# Patient Record
Sex: Male | Born: 1955 | Race: White | Hispanic: No | Marital: Married | Smoking: Never smoker
Health system: Southern US, Community
[De-identification: ages and names within clinical notes are randomized; demographics above are authoritative.]

## PROBLEM LIST (undated history)

## (undated) DIAGNOSIS — I10 Essential (primary) hypertension: Secondary | ICD-10-CM

## (undated) DIAGNOSIS — E782 Mixed hyperlipidemia: Secondary | ICD-10-CM

## (undated) DIAGNOSIS — E039 Hypothyroidism, unspecified: Secondary | ICD-10-CM

## (undated) DIAGNOSIS — G4730 Sleep apnea, unspecified: Secondary | ICD-10-CM

## (undated) DIAGNOSIS — I509 Heart failure, unspecified: Secondary | ICD-10-CM

## (undated) DIAGNOSIS — I2510 Atherosclerotic heart disease of native coronary artery without angina pectoris: Secondary | ICD-10-CM

## (undated) DIAGNOSIS — Z889 Allergy status to unspecified drugs, medicaments and biological substances status: Secondary | ICD-10-CM

## (undated) DIAGNOSIS — E119 Type 2 diabetes mellitus without complications: Secondary | ICD-10-CM

## (undated) DIAGNOSIS — L0390 Cellulitis, unspecified: Secondary | ICD-10-CM

## (undated) DIAGNOSIS — E7800 Pure hypercholesterolemia, unspecified: Secondary | ICD-10-CM

## (undated) DIAGNOSIS — J45909 Unspecified asthma, uncomplicated: Secondary | ICD-10-CM

## (undated) HISTORY — DX: Allergy status to unspecified drugs, medicaments and biological substances: Z88.9

## (undated) HISTORY — PX: OTHER SURGICAL HISTORY: SHX169

## (undated) HISTORY — DX: Atherosclerotic heart disease of native coronary artery without angina pectoris: I25.10

## (undated) HISTORY — DX: Essential (primary) hypertension: I10

## (undated) HISTORY — PX: CARPAL TUNNEL RELEASE: SHX101

## (undated) HISTORY — DX: Pure hypercholesterolemia, unspecified: E78.00

## (undated) HISTORY — DX: Cellulitis, unspecified: L03.90

## (undated) HISTORY — DX: Mixed hyperlipidemia: E78.2

## (undated) HISTORY — PX: COLONOSCOPY: SHX174

## (undated) HISTORY — DX: Type 2 diabetes mellitus without complications: E11.9

---

## 1998-09-23 ENCOUNTER — Encounter: Payer: Self-pay | Admitting: Emergency Medicine

## 1998-09-23 ENCOUNTER — Emergency Department (HOSPITAL_COMMUNITY): Admission: EM | Admit: 1998-09-23 | Discharge: 1998-09-23 | Payer: Self-pay | Admitting: Emergency Medicine

## 2004-10-31 ENCOUNTER — Ambulatory Visit (HOSPITAL_COMMUNITY): Admission: RE | Admit: 2004-10-31 | Discharge: 2004-10-31 | Payer: Self-pay | Admitting: Gastroenterology

## 2005-04-29 ENCOUNTER — Emergency Department (HOSPITAL_COMMUNITY): Admission: EM | Admit: 2005-04-29 | Discharge: 2005-04-29 | Payer: Self-pay | Admitting: Emergency Medicine

## 2010-10-15 ENCOUNTER — Other Ambulatory Visit: Payer: Self-pay | Admitting: Internal Medicine

## 2010-10-15 DIAGNOSIS — M79642 Pain in left hand: Secondary | ICD-10-CM

## 2010-10-15 DIAGNOSIS — M79641 Pain in right hand: Secondary | ICD-10-CM

## 2011-03-02 ENCOUNTER — Other Ambulatory Visit: Payer: Self-pay | Admitting: Internal Medicine

## 2011-03-02 DIAGNOSIS — M79641 Pain in right hand: Secondary | ICD-10-CM

## 2014-10-19 ENCOUNTER — Ambulatory Visit: Payer: Self-pay | Admitting: Surgery

## 2014-10-25 ENCOUNTER — Ambulatory Visit (INDEPENDENT_AMBULATORY_CARE_PROVIDER_SITE_OTHER): Payer: BLUE CROSS/BLUE SHIELD | Admitting: Neurology

## 2014-10-25 ENCOUNTER — Ambulatory Visit (INDEPENDENT_AMBULATORY_CARE_PROVIDER_SITE_OTHER): Payer: Self-pay | Admitting: Neurology

## 2014-10-25 DIAGNOSIS — G5603 Carpal tunnel syndrome, bilateral upper limbs: Secondary | ICD-10-CM

## 2014-10-25 DIAGNOSIS — G5602 Carpal tunnel syndrome, left upper limb: Secondary | ICD-10-CM

## 2014-10-25 DIAGNOSIS — G5601 Carpal tunnel syndrome, right upper limb: Secondary | ICD-10-CM

## 2014-10-28 NOTE — Progress Notes (Signed)
See procedure note.

## 2014-10-29 NOTE — Progress Notes (Signed)
  [facility]    Provider:  Dr Jaynee Eagles Referring Provider: Martinique, Betty G, MD Primary Care Physician:  Orpah Melter, MD  HPI:  Eduardo Long is a 59 y.o. male here as a referral from Dr. Martinique for evaluation of left > right arm paresthesias and numbness.  Summary:   Left APB median motor nerve showed delayed distal onset latency(9.75ms, N<4.45ms), reduced amplitude (1.29mV, N>3) and delayed F Response (40.25ms, N<19ms)  Left 2nd-digit Median sensory nerve showed delayed distal peak latency(9.47ms, N<3.9) and reduced amplitude (5uV, N>10)  Right APB median motor nerve showed delayed distal onset latency(6.2ms, N<4.7ms) and delayed F Response (38.72ms, N<73ms)  Right 2nd-digit Median sensory nerve showed delayed distal peak latency(5.55ms, N<3.9)  Bilateral Ulnar ADM motor nerves were within normal limits with normal F response latencies.  Bilateral Ulnar 5th digit sensory nerves were within normal limits.    EMG Needle evaluation was performed on the following bilateral upper extremity and paraspinal muscles:  The left Triceps showed moderately increased spontaneous activity (positive waves), increased motor unit amplitude, prolonged motor unit duration and diminished motor unit recruitment. The left Biceps showed mildly increased spontaneous activity (positive waves) and diminished motor unit recruitment. The left Pronator Teres showed increased motor unit amplitude, prolonged motor unit duration and diminished motor unit recruitment. The right Triceps showed mildly increased spontaneous activity (positive waves), increased motor unit amplitude and diminished motor unit recruitment. The bilateral lower cervical paraspinal muscles showed increased spontaneous activity(positive waves) The bilateral Deltoid, bilateral First Dorsal Interosseous, Bilateral Opponens Pollicis, right Biceps, right Pronator Teres muscles were within normal limits.   Conclusion: There is  electrophysiologic evidence for left > right C6/C7 radiculopathy. There is also concomitant moderately-severe left > right Carpal Tunnel Syndrome. Recommend MRI of the cervical spine as clinically warranted.  Eduardo Ill, MD  [facility] [address] [place], [place] [address]  Phone [phone] Fax [phone]

## 2014-10-30 NOTE — Procedures (Signed)
[facility]    Provider:  Dr Jaynee Eagles Referring Provider: Martinique, Betty G, MD Primary Care Physician:  Orpah Melter, MD  HPI:  Eduardo Long is a 59 y.o. male here as a referral from Dr. Martinique for evaluation of left > right arm paresthesias and numbness.  Summary:   Left APB median motor nerve showed delayed distal onset latency(9.66ms, N<4.79ms), reduced amplitude (1.89mV, N>3) and delayed F Response (40.49ms, N<31ms)  Left 2nd-digit Median sensory nerve showed delayed distal peak latency(9.72ms, N<3.9) and reduced amplitude (5uV, N>10)  Right APB median motor nerve showed delayed distal onset latency(6.49ms, N<4.73ms) and delayed F Response (38.32ms, N<41ms)  Right 2nd-digit Median sensory nerve showed delayed distal peak latency(5.34ms, N<3.9)  Bilateral Ulnar ADM motor nerves were within normal limits with normal F response latencies.  Bilateral Ulnar 5th digit sensory nerves were within normal limits.    EMG Needle evaluation was performed on the following bilateral upper extremity and paraspinal muscles:  The left Triceps showed moderately increased spontaneous activity (positive waves), increased motor unit amplitude, prolonged motor unit duration and diminished motor unit recruitment. The left Biceps showed mildly increased spontaneous activity (positive waves) and diminished motor unit recruitment. The left Pronator Teres showed increased motor unit amplitude, prolonged motor unit duration and diminished motor unit recruitment. The right Triceps showed mildly increased spontaneous activity (positive waves), increased motor unit amplitude and diminished motor unit recruitment. The bilateral lower cervical paraspinal muscles showed increased spontaneous activity(positive waves) The bilateral Deltoid, bilateral First Dorsal Interosseous, Bilateral Opponens Pollicis, right Biceps, right Pronator Teres muscles were within normal limits.   Conclusion: There is  electrophysiologic evidence for left > right C6/C7 radiculopathy. There is also concomitant moderately-severe left > right Carpal Tunnel Syndrome. Recommend MRI of the cervical spine as clinically warranted.  [name], MD  [facility] [address] [place], [place] [address]  Phone [phone] Fax [phone]

## 2014-11-09 ENCOUNTER — Telehealth: Payer: Self-pay | Admitting: Neurology

## 2014-11-09 NOTE — Telephone Encounter (Signed)
Patient is calling to get test results. 

## 2014-11-12 NOTE — Telephone Encounter (Signed)
Tried calling, spoke to his wife as he was not home. Left message on cell phone with details.  Eduardo Long - can you call back and let him know that I believe he has bilateral carpal tunnel syndrome. But he also appears to have a pinched nerve in his neck. The report was sent to his doctor so he can follow up with him/her or call back for more details.  Thank you

## 2014-11-13 NOTE — Telephone Encounter (Signed)
Spoke with pt and told him Dr. Jaynee Eagles believes he has bilateral carpal tunnel syndrome and it appears he has a pinched nerve in his neck. Told him the report was sent to his doctor so he can f/u up him/her or call back for more details. He is questioning if this is his PCP and what the next steps are. I told him I would ask Dr. Jaynee Eagles and get back with him. Pt verbalized understanding.

## 2014-11-13 NOTE — Telephone Encounter (Signed)
Looks like Betty Martinique MD ordered the testing. He should follow up with her thanks.

## 2014-11-14 NOTE — Telephone Encounter (Signed)
Left VM for pt to call back. Gave [facility] phone number and told him we are open until 5pm.

## 2014-11-14 NOTE — Telephone Encounter (Signed)
I spoke to his wife. Dr. Doyle Askew is taking care of the referral to orthopaedics. She says patient understands this. thanks

## 2014-11-14 NOTE — Telephone Encounter (Signed)
Spoke with pt to let him know he needs to f/u with Better Martinique, MD but pt did not recognize this name. He stated he has called PCP and they told him to contact Dr. Jaynee Eagles. He feels like he cannot get an answer. I told him I would ask Dr. Jaynee Eagles and call him back. Pt verbalized understanding.

## 2014-12-14 ENCOUNTER — Other Ambulatory Visit (HOSPITAL_BASED_OUTPATIENT_CLINIC_OR_DEPARTMENT_OTHER): Payer: Self-pay | Admitting: Family Medicine

## 2014-12-14 DIAGNOSIS — J3489 Other specified disorders of nose and nasal sinuses: Secondary | ICD-10-CM

## 2014-12-14 DIAGNOSIS — R0981 Nasal congestion: Secondary | ICD-10-CM

## 2014-12-24 ENCOUNTER — Ambulatory Visit (HOSPITAL_BASED_OUTPATIENT_CLINIC_OR_DEPARTMENT_OTHER)
Admission: RE | Admit: 2014-12-24 | Discharge: 2014-12-24 | Disposition: A | Discharge status: Home / Self Care | Payer: BLUE CROSS/BLUE SHIELD | Source: Ambulatory Visit | Attending: Family Medicine | Admitting: Family Medicine

## 2014-12-24 DIAGNOSIS — R0981 Nasal congestion: Secondary | ICD-10-CM

## 2014-12-24 DIAGNOSIS — J3489 Other specified disorders of nose and nasal sinuses: Secondary | ICD-10-CM

## 2015-03-07 ENCOUNTER — Other Ambulatory Visit: Payer: Self-pay | Admitting: Surgery

## 2015-03-07 DIAGNOSIS — D170 Benign lipomatous neoplasm of skin and subcutaneous tissue of head, face and neck: Secondary | ICD-10-CM

## 2015-03-11 ENCOUNTER — Encounter (HOSPITAL_BASED_OUTPATIENT_CLINIC_OR_DEPARTMENT_OTHER): Payer: Self-pay | Admitting: *Deleted

## 2015-03-11 NOTE — Progress Notes (Signed)
BRING ALL MEDICATIONS. COMING TOMORROW FOR BMET AND EKG 03/12/2015

## 2015-03-12 ENCOUNTER — Other Ambulatory Visit (HOSPITAL_COMMUNITY): Payer: Self-pay | Admitting: Otolaryngology

## 2015-03-12 ENCOUNTER — Encounter (HOSPITAL_BASED_OUTPATIENT_CLINIC_OR_DEPARTMENT_OTHER)
Admission: RE | Admit: 2015-03-12 | Discharge: 2015-03-12 | Disposition: A | Discharge status: Home / Self Care | Payer: BLUE CROSS/BLUE SHIELD | Source: Ambulatory Visit | Attending: Otolaryngology | Admitting: Otolaryngology

## 2015-03-12 DIAGNOSIS — Z79899 Other long term (current) drug therapy: Secondary | ICD-10-CM | POA: Diagnosis not present

## 2015-03-12 DIAGNOSIS — Z87891 Personal history of nicotine dependence: Secondary | ICD-10-CM | POA: Diagnosis not present

## 2015-03-12 DIAGNOSIS — E119 Type 2 diabetes mellitus without complications: Secondary | ICD-10-CM | POA: Diagnosis not present

## 2015-03-12 DIAGNOSIS — J45909 Unspecified asthma, uncomplicated: Secondary | ICD-10-CM | POA: Diagnosis not present

## 2015-03-12 DIAGNOSIS — J324 Chronic pansinusitis: Secondary | ICD-10-CM | POA: Diagnosis not present

## 2015-03-12 DIAGNOSIS — Z794 Long term (current) use of insulin: Secondary | ICD-10-CM | POA: Diagnosis not present

## 2015-03-12 DIAGNOSIS — J342 Deviated nasal septum: Secondary | ICD-10-CM | POA: Diagnosis present

## 2015-03-12 DIAGNOSIS — J338 Other polyp of sinus: Secondary | ICD-10-CM | POA: Diagnosis not present

## 2015-03-12 DIAGNOSIS — E039 Hypothyroidism, unspecified: Secondary | ICD-10-CM | POA: Diagnosis not present

## 2015-03-12 LAB — BASIC METABOLIC PANEL
Anion gap: 7 (ref 5–15)
BUN: 13 mg/dL (ref 6–20)
CO2: 29 mmol/L (ref 22–32)
Calcium: 8.8 mg/dL — ABNORMAL LOW (ref 8.9–10.3)
Chloride: 101 mmol/L (ref 101–111)
Creatinine, Ser: 0.97 mg/dL (ref 0.61–1.24)
GFR calc Af Amer: >60 mL/min (ref >60)
GFR calc non Af Amer: >60 mL/min (ref >60)
Glucose, Bld: 188 mg/dL — ABNORMAL HIGH (ref 65–99)
Potassium: 4.6 mmol/L (ref 3.5–5.1)
Sodium: 137 mmol/L (ref 135–145)

## 2015-03-13 ENCOUNTER — Ambulatory Visit
Admission: RE | Admit: 2015-03-13 | Discharge: 2015-03-13 | Disposition: A | Discharge status: Home / Self Care | Payer: BLUE CROSS/BLUE SHIELD | Source: Ambulatory Visit | Attending: Surgery | Admitting: Surgery

## 2015-03-13 DIAGNOSIS — D170 Benign lipomatous neoplasm of skin and subcutaneous tissue of head, face and neck: Secondary | ICD-10-CM

## 2015-03-13 MED ORDER — IOPAMIDOL (ISOVUE-300) INJECTION 61%
75.0000 mL | Freq: Once | INTRAVENOUS | Status: AC | PRN
  Administered 2015-03-13: 75 mL via INTRAVENOUS

## 2015-03-15 ENCOUNTER — Ambulatory Visit (HOSPITAL_BASED_OUTPATIENT_CLINIC_OR_DEPARTMENT_OTHER): Payer: BLUE CROSS/BLUE SHIELD | Admitting: Anesthesiology

## 2015-03-15 ENCOUNTER — Encounter (HOSPITAL_BASED_OUTPATIENT_CLINIC_OR_DEPARTMENT_OTHER): Payer: Self-pay | Admitting: Anesthesiology

## 2015-03-15 ENCOUNTER — Encounter (HOSPITAL_BASED_OUTPATIENT_CLINIC_OR_DEPARTMENT_OTHER)
Admission: RE | Disposition: A | Discharge status: Home / Self Care | Payer: Self-pay | Source: Ambulatory Visit | Attending: Otolaryngology

## 2015-03-15 ENCOUNTER — Ambulatory Visit (HOSPITAL_BASED_OUTPATIENT_CLINIC_OR_DEPARTMENT_OTHER)
Admission: RE | Admit: 2015-03-15 | Discharge: 2015-03-15 | Disposition: A | Discharge status: Home / Self Care | Payer: BLUE CROSS/BLUE SHIELD | Source: Ambulatory Visit | Attending: Otolaryngology | Admitting: Otolaryngology

## 2015-03-15 DIAGNOSIS — J324 Chronic pansinusitis: Secondary | ICD-10-CM | POA: Insufficient documentation

## 2015-03-15 DIAGNOSIS — Z794 Long term (current) use of insulin: Secondary | ICD-10-CM | POA: Insufficient documentation

## 2015-03-15 DIAGNOSIS — E119 Type 2 diabetes mellitus without complications: Secondary | ICD-10-CM | POA: Insufficient documentation

## 2015-03-15 DIAGNOSIS — J342 Deviated nasal septum: Secondary | ICD-10-CM | POA: Diagnosis not present

## 2015-03-15 DIAGNOSIS — Z79899 Other long term (current) drug therapy: Secondary | ICD-10-CM | POA: Insufficient documentation

## 2015-03-15 DIAGNOSIS — E039 Hypothyroidism, unspecified: Secondary | ICD-10-CM | POA: Insufficient documentation

## 2015-03-15 DIAGNOSIS — Z87891 Personal history of nicotine dependence: Secondary | ICD-10-CM | POA: Insufficient documentation

## 2015-03-15 DIAGNOSIS — J45909 Unspecified asthma, uncomplicated: Secondary | ICD-10-CM | POA: Insufficient documentation

## 2015-03-15 DIAGNOSIS — J338 Other polyp of sinus: Secondary | ICD-10-CM | POA: Insufficient documentation

## 2015-03-15 HISTORY — DX: Unspecified asthma, uncomplicated: J45.909

## 2015-03-15 HISTORY — DX: Type 2 diabetes mellitus without complications: E11.9

## 2015-03-15 HISTORY — DX: Hypothyroidism, unspecified: E03.9

## 2015-03-15 HISTORY — PX: SINUS ENDO WITH FUSION: SHX5329

## 2015-03-15 LAB — GLUCOSE, CAPILLARY
Glucose-Capillary: 218 mg/dL — ABNORMAL HIGH (ref 65–99)
Glucose-Capillary: 166 mg/dL — ABNORMAL HIGH (ref 65–99)
Glucose-Capillary: 190 mg/dL — ABNORMAL HIGH (ref 65–99)

## 2015-03-15 SURGERY — SURGERY, PARANASAL SINUS, ENDOSCOPIC, WITH NASAL SEPTOPLASTY, TURBINOPLASTY, AND MAXILLARY SINUSOTOMY
Anesthesia: General | Site: Nose

## 2015-03-15 MED ORDER — ONDANSETRON HCL 4 MG/2ML IJ SOLN
INTRAMUSCULAR | Status: DC | PRN
  Administered 2015-03-15: 4 mg via INTRAVENOUS

## 2015-03-15 MED ORDER — EPHEDRINE SULFATE 50 MG/ML IJ SOLN
INTRAMUSCULAR | Status: DC | PRN
  Administered 2015-03-15 (×4): 10 mg via INTRAVENOUS

## 2015-03-15 MED ORDER — HYDROCODONE-ACETAMINOPHEN 5-325 MG PO TABS: 1.0000 | ORAL_TABLET | Freq: Four times a day (QID) | ORAL | Status: DC | PRN

## 2015-03-15 MED ORDER — FENTANYL CITRATE (PF) 100 MCG/2ML IJ SOLN
50.0000 ug | INTRAMUSCULAR | Status: DC | PRN
  Administered 2015-03-15: 100 ug via INTRAVENOUS

## 2015-03-15 MED ORDER — OXYMETAZOLINE HCL 0.05 % NA SOLN
NASAL | Status: DC | PRN
  Administered 2015-03-15: 1 via TOPICAL

## 2015-03-15 MED ORDER — PREDNISONE 10 MG PO TABS: ORAL_TABLET | ORAL | Status: DC

## 2015-03-15 MED ORDER — SUCCINYLCHOLINE CHLORIDE 20 MG/ML IJ SOLN
INTRAMUSCULAR | Status: DC | PRN
  Administered 2015-03-15: 100 mg via INTRAVENOUS

## 2015-03-15 MED ORDER — HYDROMORPHONE HCL 1 MG/ML IJ SOLN
INTRAMUSCULAR | Status: AC
  Filled 2015-03-15: qty 1

## 2015-03-15 MED ORDER — LIDOCAINE-EPINEPHRINE 1 %-1:100000 IJ SOLN
INTRAMUSCULAR | Status: AC
  Filled 2015-03-15: qty 1

## 2015-03-15 MED ORDER — MIDAZOLAM HCL 2 MG/2ML IJ SOLN
INTRAMUSCULAR | Status: AC
  Filled 2015-03-15: qty 4

## 2015-03-15 MED ORDER — PROPOFOL 10 MG/ML IV BOLUS
INTRAVENOUS | Status: DC | PRN
  Administered 2015-03-15: 200 mg via INTRAVENOUS

## 2015-03-15 MED ORDER — OXYCODONE HCL 5 MG/5ML PO SOLN: 5.0000 mg | Freq: Once | ORAL | Status: DC | PRN

## 2015-03-15 MED ORDER — MUPIROCIN 2 % EX OINT
TOPICAL_OINTMENT | CUTANEOUS | Status: AC
  Filled 2015-03-15: qty 22

## 2015-03-15 MED ORDER — DEXAMETHASONE SODIUM PHOSPHATE 4 MG/ML IJ SOLN
INTRAMUSCULAR | Status: DC | PRN
  Administered 2015-03-15: 10 mg via INTRAVENOUS

## 2015-03-15 MED ORDER — LIDOCAINE HCL (CARDIAC) 20 MG/ML IV SOLN
INTRAVENOUS | Status: AC
  Filled 2015-03-15: qty 5

## 2015-03-15 MED ORDER — MUPIROCIN 2 % EX OINT
TOPICAL_OINTMENT | CUTANEOUS | Status: DC | PRN
  Administered 2015-03-15: 1 via NASAL

## 2015-03-15 MED ORDER — ONDANSETRON HCL 4 MG/2ML IJ SOLN
INTRAMUSCULAR | Status: AC
  Filled 2015-03-15: qty 2

## 2015-03-15 MED ORDER — HYDROMORPHONE HCL 1 MG/ML IJ SOLN
0.2500 mg | INTRAMUSCULAR | Status: DC | PRN
  Administered 2015-03-15 (×2): 0.5 mg via INTRAVENOUS

## 2015-03-15 MED ORDER — SCOPOLAMINE 1 MG/3DAYS TD PT72: 1.0000 | MEDICATED_PATCH | Freq: Once | TRANSDERMAL | Status: DC | PRN

## 2015-03-15 MED ORDER — OXYCODONE HCL 5 MG PO TABS: 5.0000 mg | ORAL_TABLET | Freq: Once | ORAL | Status: DC | PRN

## 2015-03-15 MED ORDER — MIDAZOLAM HCL 2 MG/2ML IJ SOLN
1.0000 mg | INTRAMUSCULAR | Status: DC | PRN
  Administered 2015-03-15: 1 mg via INTRAVENOUS

## 2015-03-15 MED ORDER — FENTANYL CITRATE (PF) 100 MCG/2ML IJ SOLN
INTRAMUSCULAR | Status: AC
  Filled 2015-03-15: qty 4

## 2015-03-15 MED ORDER — PROPOFOL 500 MG/50ML IV EMUL
INTRAVENOUS | Status: AC
  Filled 2015-03-15: qty 50

## 2015-03-15 MED ORDER — LIDOCAINE-EPINEPHRINE 1 %-1:100000 IJ SOLN
INTRAMUSCULAR | Status: DC | PRN
  Administered 2015-03-15: 7 mL

## 2015-03-15 MED ORDER — ONDANSETRON HCL 4 MG/2ML IJ SOLN: 4.0000 mg | Freq: Four times a day (QID) | INTRAMUSCULAR | Status: DC | PRN

## 2015-03-15 MED ORDER — EPHEDRINE SULFATE 50 MG/ML IJ SOLN
INTRAMUSCULAR | Status: AC
  Filled 2015-03-15: qty 1

## 2015-03-15 MED ORDER — LACTATED RINGERS IV SOLN
INTRAVENOUS | Status: DC
  Administered 2015-03-15: 08:00:00 via INTRAVENOUS

## 2015-03-15 MED ORDER — GLYCOPYRROLATE 0.2 MG/ML IJ SOLN: 0.2000 mg | Freq: Once | INTRAMUSCULAR | Status: DC | PRN

## 2015-03-15 MED ORDER — BACITRACIN ZINC 500 UNIT/GM EX OINT
TOPICAL_OINTMENT | CUTANEOUS | Status: AC
  Filled 2015-03-15: qty 28.35

## 2015-03-15 MED ORDER — OXYMETAZOLINE HCL 0.05 % NA SOLN
NASAL | Status: AC
  Filled 2015-03-15: qty 15

## 2015-03-15 MED ORDER — SODIUM CHLORIDE 0.9 % IR SOLN
Status: DC | PRN
  Administered 2015-03-15: 800 mL

## 2015-03-15 MED ORDER — LIDOCAINE HCL (CARDIAC) 20 MG/ML IV SOLN
INTRAVENOUS | Status: DC | PRN
  Administered 2015-03-15: 50 mg via INTRAVENOUS

## 2015-03-15 MED ORDER — DEXAMETHASONE SODIUM PHOSPHATE 10 MG/ML IJ SOLN
INTRAMUSCULAR | Status: AC
  Filled 2015-03-15: qty 1

## 2015-03-15 MED ORDER — SUCCINYLCHOLINE CHLORIDE 20 MG/ML IJ SOLN
INTRAMUSCULAR | Status: AC
  Filled 2015-03-15: qty 1

## 2015-03-15 SURGICAL SUPPLY — 59 items
ATTRACTOMAT 16X20 MAGNETIC DRP (DRAPES) IMPLANT
BLADE RAD40 ROTATE 4M 4 5PK (BLADE) IMPLANT
BLADE RAD60 ROTATE M4 4 5PK (BLADE) ×3 IMPLANT
BLADE ROTATE RAD 12 4 M4 (BLADE) IMPLANT
BLADE ROTATE RAD 40 4 M4 (BLADE) IMPLANT
BLADE ROTATE TRICUT 4X13 M4 (BLADE) ×3 IMPLANT
BLADE TRICUT ROTATE M4 4 5PK (BLADE) IMPLANT
BUR HS RAD FRONTAL 3 (BURR) IMPLANT
CANISTER SUC SOCK COL 7IN (MISCELLANEOUS) ×6 IMPLANT
CANISTER SUCT 1200ML W/VALVE (MISCELLANEOUS) ×6 IMPLANT
COAGULATOR SUCT SWTCH 10FR 6 (ELECTROSURGICAL) IMPLANT
CORDS BIPOLAR (ELECTRODE) IMPLANT
DECANTER SPIKE VIAL GLASS SM (MISCELLANEOUS) IMPLANT
DRESSING ADAPTIC 1/2  N-ADH (PACKING) IMPLANT
DRESSING NASAL KENNEDY 3.5X.9 (MISCELLANEOUS) IMPLANT
DRSG NASAL KENNEDY 3.5X.9 (MISCELLANEOUS)
DRSG NASAL KENNEDY LMNT 8CM (GAUZE/BANDAGES/DRESSINGS) IMPLANT
DRSG NASOPORE 8CM (GAUZE/BANDAGES/DRESSINGS) ×3 IMPLANT
DRSG TELFA 3X8 NADH (GAUZE/BANDAGES/DRESSINGS) IMPLANT
ELECT COATED BLADE 2.86 ST (ELECTRODE) IMPLANT
ELECT REM PT RETURN 9FT ADLT (ELECTROSURGICAL) ×3
ELECTRODE REM PT RTRN 9FT ADLT (ELECTROSURGICAL) ×2 IMPLANT
GAUZE SPONGE 4X4 16PLY XRAY LF (GAUZE/BANDAGES/DRESSINGS) IMPLANT
GAUZE VASELINE FOILPK 1/2 X 72 (GAUZE/BANDAGES/DRESSINGS) IMPLANT
GLOVE BIO SURGEON STRL SZ7.5 (GLOVE) ×3 IMPLANT
GLOVE EXAM NITRILE EXT CUFF MD (GLOVE) ×3 IMPLANT
GLOVE SURG SS PI 7.0 STRL IVOR (GLOVE) ×3 IMPLANT
GOWN STRL REUS W/ TWL LRG LVL3 (GOWN DISPOSABLE) ×4 IMPLANT
GOWN STRL REUS W/TWL LRG LVL3 (GOWN DISPOSABLE) ×2
HEMOSTAT SURGICEL .5X2 ABSORB (HEMOSTASIS) IMPLANT
IV NS 1000ML (IV SOLUTION)
IV NS 1000ML BAXH (IV SOLUTION) IMPLANT
IV NS 500ML (IV SOLUTION) ×2
IV NS 500ML BAXH (IV SOLUTION) ×4 IMPLANT
NEEDLE PRECISIONGLIDE 27X1.5 (NEEDLE) ×3 IMPLANT
NEEDLE SPNL 25GX3.5 QUINCKE BL (NEEDLE) ×3 IMPLANT
NS IRRIG 1000ML POUR BTL (IV SOLUTION) ×3 IMPLANT
PACK BASIN DAY SURGERY FS (CUSTOM PROCEDURE TRAY) ×3 IMPLANT
PACK ENT DAY SURGERY (CUSTOM PROCEDURE TRAY) ×3 IMPLANT
PATTIES SURGICAL .5 X3 (DISPOSABLE) ×3 IMPLANT
PENCIL BUTTON HOLSTER BLD 10FT (ELECTRODE) IMPLANT
SHEET SILASTIC 8X6X.030 25-30 (MISCELLANEOUS) IMPLANT
SLEEVE SCD COMPRESS KNEE MED (MISCELLANEOUS) ×3 IMPLANT
SOLUTION ANTI FOG 6CC (MISCELLANEOUS) ×3 IMPLANT
SOLUTION BUTLER CLEAR DIP (MISCELLANEOUS) IMPLANT
SPLINT NASAL AIRWAY SILICONE (MISCELLANEOUS) IMPLANT
SPONGE GAUZE 2X2 8PLY STRL LF (GAUZE/BANDAGES/DRESSINGS) ×3 IMPLANT
SUT CHROMIC 2 0 SH (SUTURE) IMPLANT
SUT CHROMIC 4 0 P 3 18 (SUTURE) ×3 IMPLANT
SUT ETHILON 3 0 PS 1 (SUTURE) IMPLANT
SUT PLAIN 4 0 ~~LOC~~ 1 (SUTURE) ×3 IMPLANT
TOWEL OR 17X24 6PK STRL BLUE (TOWEL DISPOSABLE) ×6 IMPLANT
TRACKER ENT INSTRUMENT (MISCELLANEOUS) ×3 IMPLANT
TRACKER ENT PATIENT (MISCELLANEOUS) ×3 IMPLANT
TRAY DSU PREP LF (CUSTOM PROCEDURE TRAY) ×3 IMPLANT
TUBE CONNECTING 20X1/4 (TUBING) ×3 IMPLANT
TUBE SALEM SUMP 16 FR W/ARV (TUBING) IMPLANT
TUBING STRAIGHTSHOT EPS 5PK (TUBING) ×3 IMPLANT
YANKAUER SUCT BULB TIP NO VENT (SUCTIONS) ×3 IMPLANT

## 2015-03-15 NOTE — Brief Op Note (Signed)
03/15/2015  10:35 AM  PATIENT:  Eduardo Long  59 y.o. male  PRE-OPERATIVE DIAGNOSIS:  CHRONIC SINUSITIS,SEPTUM DEVIATION  POST-OPERATIVE DIAGNOSIS:  CHRONIC SINUSITIS,SEPTUM DEVIATION  PROCEDURE:  Procedure(s): ENDOSCOPIC SINUS SURGERY WITH FUSION (Bilateral), BILATERAL MAXILLARY ANTROSTOMY, BILATERAL TOTAL ETHMOIDECTOMY, BILATERAL SPHENOIDOTOMY  SURGEON:  Surgeon(s) and Role:    * Melida Quitter, MD - Primary  PHYSICIAN ASSISTANT:   ASSISTANTS: none   ANESTHESIA:   general  EBL:  Total I/O In: 1800 [I.V.:1800] Out: -   BLOOD ADMINISTERED:none  DRAINS: none   LOCAL MEDICATIONS USED:  LIDOCAINE WITH EPINEPHRINE  SPECIMEN:  Source of Specimen:  Right and left sinus contents  DISPOSITION OF SPECIMEN:  PATHOLOGY  COUNTS:  YES  TOURNIQUET:  * No tourniquets in log *  DICTATION: .Other Dictation: Dictation Number [id number]  PLAN OF CARE: Discharge to home after PACU  PATIENT DISPOSITION:  PACU - hemodynamically stable.   Delay start of Pharmacological VTE agent (>24hrs) due to surgical blood loss or risk of bleeding: no

## 2015-03-15 NOTE — Anesthesia Preprocedure Evaluation (Signed)
Anesthesia Evaluation  Patient identified by MRN, date of birth, ID band Patient awake    Reviewed: Allergy & Precautions, NPO status , Patient's Chart, lab work & pertinent test results  Airway Mallampati: II   Neck ROM: full    Dental   Pulmonary asthma , former smoker,    breath sounds clear to auscultation       Cardiovascular negative cardio ROS   Rhythm:regular Rate:Normal     Neuro/Psych    GI/Hepatic   Endo/Other  diabetes, Type 2Hypothyroidism obese  Renal/GU      Musculoskeletal   Abdominal   Peds  Hematology   Anesthesia Other Findings   Reproductive/Obstetrics                             Anesthesia Physical Anesthesia Plan  ASA: II  Anesthesia Plan: General   Post-op Pain Management:    Induction: Intravenous  Airway Management Planned: Oral ETT  Additional Equipment:   Intra-op Plan:   Post-operative Plan: Extubation in OR  Informed Consent: I have reviewed the patients History and Physical, chart, labs and discussed the procedure including the risks, benefits and alternatives for the proposed anesthesia with the patient or authorized representative who has indicated his/her understanding and acceptance.     Plan Discussed with: CRNA, Anesthesiologist and Surgeon  Anesthesia Plan Comments:         Anesthesia Quick Evaluation

## 2015-03-15 NOTE — Anesthesia Procedure Notes (Signed)
Procedure Name: Intubation Performed by: Terrance Mass Pre-anesthesia Checklist: Patient identified, Emergency Drugs available, Suction available and Patient being monitored Patient Re-evaluated:Patient Re-evaluated prior to inductionOxygen Delivery Method: Circle System Utilized Preoxygenation: Pre-oxygenation with 100% oxygen Intubation Type: IV induction Ventilation: Mask ventilation without difficulty Grade View: Grade II Tube type: Oral Tube size: 7.0 mm Number of attempts: 1 Airway Equipment and Method: Stylet and Oral airway Placement Confirmation: ETT inserted through vocal cords under direct vision and positive ETCO2 Secured at: 24 cm Tube secured with: Tape Dental Injury: Teeth and Oropharynx as per pre-operative assessment

## 2015-03-15 NOTE — Anesthesia Postprocedure Evaluation (Signed)
Anesthesia Post Note  Patient: Eduardo Long  Procedure(s) Performed: Procedure(s) (LRB): ENDOSCOPIC SINUS SURGERY WITH FUSION (Bilateral)  Anesthesia type: General  Patient location: PACU  Post pain: Pain level controlled and Adequate analgesia  Post assessment: Post-op Vital signs reviewed, Patient's Cardiovascular Status Stable, Respiratory Function Stable, Patent Airway and Pain level controlled  Last Vitals:  Filed Vitals:   03/15/15 1130  BP: 131/74  Pulse: 100  Temp:   Resp: 17    Post vital signs: Reviewed and stable  Level of consciousness: awake, alert  and oriented  Complications: No apparent anesthesia complications

## 2015-03-15 NOTE — Transfer of Care (Signed)
Immediate Anesthesia Transfer of Care Note  Patient: Eduardo Long  Procedure(s) Performed: Procedure(s): ENDOSCOPIC SINUS SURGERY WITH FUSION (Bilateral)  Patient Location: PACU  Anesthesia Type:General  Level of Consciousness: awake, alert  and oriented  Airway & Oxygen Therapy: Patient Spontanous Breathing and Patient connected to face mask oxygen  Post-op Assessment: Report given to RN and Post -op Vital signs reviewed and stable  Post vital signs: Reviewed and stable  Last Vitals:  Filed Vitals:   03/15/15 0756  BP: 134/78  Pulse: 82  Temp: 36.7 C  Resp: 20    Complications: No apparent anesthesia complications

## 2015-03-15 NOTE — Discharge Instructions (Signed)

## 2015-03-15 NOTE — H&P (Signed)
Eduardo Long is an 59 y.o. male.   Chief Complaint: Nasal polyps, chronic sinusitis, septal deviation HPI: 59 year old male with two years of decreased sense of smell and nasal obstruction.  He was found to have polypoid sinusitis and septal deviation.  Past Medical History  Diagnosis Date  . Diabetes mellitus without complication ([place])   . Asthma   . Hypothyroidism     Past Surgical History  Procedure Laterality Date  . Colonoscopy    . Left carpal tunnel      History reviewed. No pertinent family history. Social History:  reports that he has quit smoking. His smoking use included Cigarettes. He does not have any smokeless tobacco history on file. He reports that he does not drink alcohol or use illicit drugs.  Allergies: No Known Allergies  Medications Prior to Admission  Medication Sig Dispense Refill  . albuterol (PROVENTIL HFA;VENTOLIN HFA) 108 (90 BASE) MCG/ACT inhaler Inhale into the lungs every 6 (six) hours as needed for wheezing or shortness of breath.    . Ascorbic Acid (VITAMIN C) 100 MG tablet Take 100 mg by mouth daily.    Marland Kitchen atorvastatin (LIPITOR) 40 MG tablet Take 40 mg by mouth daily.    . Insulin Aspart (NOVOLOG [place]) Inject into the skin. 25-30 UNITS THREE TIMES A DAY - SLIDING SCALE    . levothyroxine (SYNTHROID, LEVOTHROID) 200 MCG tablet Take 200 mcg by mouth daily before breakfast.    . moxifloxacin (AVELOX) 400 MG tablet Take 400 mg by mouth daily at 8 pm.    . predniSONE (DELTASONE) 20 MG tablet Take 20 mg by mouth daily with breakfast. 3 tabs times 3 days, 2 tabs times 2 days, then 1 tab times 3    . pyridOXINE (VITAMIN B-6) 100 MG tablet Take 100 mg by mouth daily.      Results for orders placed or performed during the hospital encounter of 03/15/15 (from the past 48 hour(s))  Glucose, capillary     Status: Abnormal   Collection Time: 03/15/15  8:10 AM  Result Value Ref Range   Glucose-Capillary 218 (H) 65 - 99 mg/dL   Ct Head W Wo  Contrast  03/13/2015  CLINICAL DATA:  Scalp lipomas. EXAM: CT HEAD WITHOUT AND WITH CONTRAST TECHNIQUE: Contiguous axial images were obtained from the base of the skull through the vertex without and with intravenous contrast CONTRAST:  5mL ISOVUE-300 IOPAMIDOL (ISOVUE-300) INJECTION 61% COMPARISON:  None. FINDINGS: A lipoma in the left posterior occipital scalp measures 5.8 x 1.6 x 5.5 cm. There are several septations within this lipoma. No enhancing soft tissue is present. A 1.4 cm right frontal scalp lipoma is also present. No other focal lipomatous lesions are present. Scalp is otherwise within normal limits. A fluid level is present in the left maxillary sinus. There is scattered fluid throughout the ethmoid air cells bilaterally. There is some mucosal thickening into the inferior frontal sinuses bilaterally. The mastoid air cells are clear. The calvarium is intact. Mild physiologic calcifications are present within the dentate nuclei bilaterally. No acute infarct, hemorrhage, or mass lesion is present. The ventricles are of normal size. The basal ganglia are intact. The cortex is unremarkable. No significant extra-axial fluid collection is present. The postcontrast images demonstrate no pathologic enhancement. IMPRESSION: 1. 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 There no enhancing lesions to suggest malignancy. Recommend continued clinical all observation for any growth of this leak and. 2. More simple appearing lipoma  in the anterior right frontal scalp. 3. Normal CT appearance of the brain for age. Electronically Signed   By: San Morelle M.D.   On: 03/13/2015 14:03    Review of Systems  Respiratory: Positive for wheezing.   All other systems reviewed and are negative.   Blood pressure 134/78, pulse 82, temperature 98.1 F (36.7 C), temperature source Oral, resp. rate 20, height 5\' 10"  (1.778 m), weight 102.967 kg (227 lb), SpO2 98 %. Physical Exam   Constitutional: He is oriented to person, place, and time. He appears well-developed and well-nourished. No distress.  HENT:  Head: Normocephalic and atraumatic.  Right Ear: External ear normal.  Left Ear: External ear normal.  Mouth/Throat: Oropharynx is clear and moist.  Septum deviates to the left.  Middle meatus polypoid change.  Eyes: Conjunctivae and EOM are normal. Pupils are equal, round, and reactive to light.  Neck: Normal range of motion. Neck supple.  Cardiovascular: Normal rate.   Respiratory: Effort normal.  Musculoskeletal: Normal range of motion.  Neurological: He is alert and oriented to person, place, and time. No cranial nerve deficit.  Skin: Skin is warm and dry.  Psychiatric: He has a normal mood and affect. His behavior is normal. Judgment and thought content normal.     Assessment/Plan Septal deviation, chronic pansinusitis, nasal polyps To OR for endoscopic sinus surgery and septoplasty.  Eduardo Long 03/15/2015, 8:24 AM

## 2015-03-18 ENCOUNTER — Encounter (HOSPITAL_BASED_OUTPATIENT_CLINIC_OR_DEPARTMENT_OTHER): Payer: Self-pay | Admitting: Otolaryngology

## 2015-03-18 NOTE — Op Note (Signed)
NAMEROBSON, Eduardo Long NO.:  [id number]  MEDICAL RECORD NO.:  [id number]  LOCATION:                               FACILITY:  [facility]  PHYSICIAN:  Onnie Graham, MD     DATE OF BIRTH:  09/14/1955  DATE OF PROCEDURE:  03/15/2015 DATE OF DISCHARGE:  03/15/2015                              OPERATIVE REPORT   PREOPERATIVE DIAGNOSES:  Chronic pansinusitis and nasal polyps and septal deviation.  POSTOPERATIVE DIAGNOSES:  Chronic pansinusitis and nasal polyps and septal deviation.  PROCEDURES: 1. Bilateral total ethmoidectomy. 2. Bilateral maxillary antrostomy. 3. Bilateral sphenoidotomy. 4. Fusion image guidance.  SURGEON:  Onnie Graham, M.D.  ANESTHESIA:  General endotracheal anesthesia.  COMPLICATIONS:  None.  INDICATIONS:  The patient is a 59 year old male with a couple of year history of decreased sense of smell and taste as well as nasal obstruction.  Symptoms have not responded to antibiotic therapy, but prednisone therapy did bring about some benefit for a brief time.  Polyp disease was seen on the CT and he presents for surgical management.  FINDINGS:  There was polypoid tissue in both middle meatus regions as well as the olfactory cleft area.  There are polypoid changes through the ethmoid sinuses on both sides.  The nasal septum was found to be fairly midline with a slight deviation to the left, but septoplasty was not performed because it did not appear to be obstructive.  DESCRIPTION OF PROCEDURE:  The patient was identified in the holding room, informed consent having been obtained including discussion of risks, benefits, alternatives, the patient was brought to the operating suite and put on the operating table in supine position.  Anesthesia was induced and the patient was intubated by the Anesthesia team without difficulty.  The patient was given intravenous antibiotics and steroids during the case.  The eyes were lubricated and the face  was prepped and draped in sterile fashion after first placing the fusion, entering on the forehead.  The patient was then registered to the fusion image guidance system in the standard fashion and eyes were tape closed with tape.  Afrin pledgets were placed on both sides of the nose.  After several minutes, the right-sided pledgets were removed and the nasal passage accepted with 0 degree telescope.  The lateral nasal wall was then injected with local anesthetic in a standard fashion.  The microdebrider was then used to remove polypoid tissue from the middle meatus and from the olfactory cleft as well.  The ethmoid bulla was taken down with microdebrider and septations in the anterior ethmoid were removed as well.  An angled telescope was then used to evaluate the lateral nasal wall and the maxillary opening was able to be identified after first removing the uncinate process.  The opening was then widened posteriorly and inferiorly using through cuts and back biters as well as the microdebrider.  This resulted in a wide antrostomy.  Continued dissection was then performed through the ethmoid sinus through the basal lamella into the posterior ethmoid where septations were removed back to the skull base as well as the sphenoid rostrum.  The middle turbinate was lateralized and the superior meatus  encountered with telescope.  Posterior septum was injected with local anesthetic.  Polyps were removed as the superior meatus using the microdebrider and the superior turbinate was partially resected in the same fashion.  The sphenoid opening was able to be identified with the microdebrider and was then widened laterally and superiorly, resulting in a wide sphenoidotomy.  The middle turbinate was again medialized and the skull base was then followed out in retrograde fashion using a curved microdebrider, removing septations up into where the frontal recess would be except that the patient did not  have a developed frontal sinus. Ethmoid septations were removed into that area until the entire sinus was opened.  Afrin pledgets were then placed in the right ethmoid cavity.  The same procedure was then carried out on the left side including injection to the lateral nasal wall, removal of polyps from the middle meatus and olfactory cleft, removal of the ethmoid bulla and anterior ethmoid cells, widening the natural maxillary ostium posteriorly and inferiorly resulting in a wide antrostomy, penetration of the basal lamella and removal of posterior ethmoid cells, injection of the posterior septum, removal of superior meatus polyps and partial removal of superior turbinate, widening of the sphenoid opening laterally and superiorly, resulting in a wide sphenoidotomy, and retrograde dissection of the skull base to the anterior ethmoid into this area with the frontal recess would be opening the ethmoid sinus fully.  Afrin pledgets were placed in this side as well.  After a few minutes, the pledgets on the right were removed and the ethmoid cavity suctioned.  Half of the nasal port packed, coated, and Bactroban ointment was then placed in the ethmoid cavity and saturated saline. The same was then done on the left side.  The throat was then suctioned and he was cleaned off and returned to anesthesia for wake up.  He was extubated and moved to the recovery room in stable condition.     Onnie Graham, MD     DDB/MEDQ  D:  03/15/2015  T:  03/16/2015  Job:  [id number]  cc:   Onnie Graham, M.D.'s Office

## 2015-03-22 ENCOUNTER — Encounter (HOSPITAL_BASED_OUTPATIENT_CLINIC_OR_DEPARTMENT_OTHER): Payer: Self-pay | Admitting: *Deleted

## 2015-03-24 ENCOUNTER — Other Ambulatory Visit: Payer: Self-pay | Admitting: Orthopedic Surgery

## 2015-03-29 ENCOUNTER — Encounter (HOSPITAL_BASED_OUTPATIENT_CLINIC_OR_DEPARTMENT_OTHER): Payer: Self-pay | Admitting: *Deleted

## 2015-03-29 ENCOUNTER — Encounter (HOSPITAL_COMMUNITY)
Admission: RE | Disposition: A | Discharge status: Home / Self Care | Payer: Self-pay | Source: Ambulatory Visit | Attending: Emergency Medicine

## 2015-03-29 ENCOUNTER — Emergency Department (HOSPITAL_COMMUNITY)
Admission: EM | Admit: 2015-03-29 | Discharge: 2015-03-29 | Discharge status: Absent without leave | Payer: BLUE CROSS/BLUE SHIELD | Source: Home / Self Care

## 2015-03-29 ENCOUNTER — Ambulatory Visit (HOSPITAL_BASED_OUTPATIENT_CLINIC_OR_DEPARTMENT_OTHER)
Admission: RE | Admit: 2015-03-29 | Discharge: 2015-03-29 | Disposition: A | Discharge status: Home / Self Care | Payer: BLUE CROSS/BLUE SHIELD | Source: Ambulatory Visit | Attending: Emergency Medicine | Admitting: Emergency Medicine

## 2015-03-29 ENCOUNTER — Ambulatory Visit (HOSPITAL_BASED_OUTPATIENT_CLINIC_OR_DEPARTMENT_OTHER): Payer: BLUE CROSS/BLUE SHIELD | Admitting: Anesthesiology

## 2015-03-29 DIAGNOSIS — L03116 Cellulitis of left lower limb: Secondary | ICD-10-CM | POA: Insufficient documentation

## 2015-03-29 DIAGNOSIS — Z87891 Personal history of nicotine dependence: Secondary | ICD-10-CM | POA: Insufficient documentation

## 2015-03-29 DIAGNOSIS — E11628 Type 2 diabetes mellitus with other skin complications: Secondary | ICD-10-CM | POA: Diagnosis not present

## 2015-03-29 DIAGNOSIS — Z6834 Body mass index (BMI) 34.0-34.9, adult: Secondary | ICD-10-CM | POA: Insufficient documentation

## 2015-03-29 DIAGNOSIS — Z794 Long term (current) use of insulin: Secondary | ICD-10-CM | POA: Diagnosis not present

## 2015-03-29 DIAGNOSIS — E039 Hypothyroidism, unspecified: Secondary | ICD-10-CM | POA: Diagnosis not present

## 2015-03-29 DIAGNOSIS — Z79899 Other long term (current) drug therapy: Secondary | ICD-10-CM | POA: Insufficient documentation

## 2015-03-29 DIAGNOSIS — J45909 Unspecified asthma, uncomplicated: Secondary | ICD-10-CM | POA: Insufficient documentation

## 2015-03-29 DIAGNOSIS — Z7952 Long term (current) use of systemic steroids: Secondary | ICD-10-CM | POA: Insufficient documentation

## 2015-03-29 DIAGNOSIS — G5601 Carpal tunnel syndrome, right upper limb: Secondary | ICD-10-CM | POA: Insufficient documentation

## 2015-03-29 DIAGNOSIS — L089 Local infection of the skin and subcutaneous tissue, unspecified: Secondary | ICD-10-CM | POA: Diagnosis present

## 2015-03-29 HISTORY — PX: CARPAL TUNNEL RELEASE: SHX101

## 2015-03-29 LAB — GLUCOSE, CAPILLARY: Glucose-Capillary: 86 mg/dL (ref 65–99)

## 2015-03-29 SURGERY — CARPAL TUNNEL RELEASE
Anesthesia: Monitor Anesthesia Care | Site: Wrist | Laterality: Right

## 2015-03-29 MED ORDER — MIDAZOLAM HCL 2 MG/2ML IJ SOLN
INTRAMUSCULAR | Status: AC
  Filled 2015-03-29: qty 4

## 2015-03-29 MED ORDER — CHLORHEXIDINE GLUCONATE 4 % EX LIQD: 60.0000 mL | Freq: Once | CUTANEOUS | Status: DC

## 2015-03-29 MED ORDER — CEFAZOLIN SODIUM-DEXTROSE 2-3 GM-% IV SOLR
INTRAVENOUS | Status: AC
  Filled 2015-03-29: qty 50

## 2015-03-29 MED ORDER — FENTANYL CITRATE (PF) 100 MCG/2ML IJ SOLN: 50.0000 ug | INTRAMUSCULAR | Status: DC | PRN

## 2015-03-29 MED ORDER — CEFAZOLIN SODIUM-DEXTROSE 2-3 GM-% IV SOLR: 2.0000 g | INTRAVENOUS | Status: DC

## 2015-03-29 MED ORDER — CLINDAMYCIN HCL 150 MG PO CAPS: 300.0000 mg | ORAL_CAPSULE | Freq: Four times a day (QID) | ORAL | Status: AC

## 2015-03-29 MED ORDER — LIDOCAINE HCL (CARDIAC) 20 MG/ML IV SOLN
INTRAVENOUS | Status: AC
  Filled 2015-03-29: qty 5

## 2015-03-29 MED ORDER — CLINDAMYCIN PHOSPHATE 600 MG/50ML IV SOLN
600.0000 mg | Freq: Once | INTRAVENOUS | Status: AC
Start: 2015-03-29 — End: 2015-03-29
  Administered 2015-03-29: 600 mg via INTRAVENOUS
  Filled 2015-03-29: qty 50

## 2015-03-29 MED ORDER — ONDANSETRON HCL 4 MG/2ML IJ SOLN
INTRAMUSCULAR | Status: AC
  Filled 2015-03-29: qty 2

## 2015-03-29 MED ORDER — FENTANYL CITRATE (PF) 100 MCG/2ML IJ SOLN
INTRAMUSCULAR | Status: AC
  Filled 2015-03-29: qty 4

## 2015-03-29 MED ORDER — SCOPOLAMINE 1 MG/3DAYS TD PT72
1.0000 | MEDICATED_PATCH | Freq: Once | TRANSDERMAL | Status: DC | PRN
Start: 2015-03-29 — End: 2015-03-29

## 2015-03-29 MED ORDER — PROPOFOL 10 MG/ML IV BOLUS
INTRAVENOUS | Status: AC
  Filled 2015-03-29: qty 20

## 2015-03-29 MED ORDER — LACTATED RINGERS IV SOLN
INTRAVENOUS | Status: DC
  Administered 2015-03-29: 10 mL/h via INTRAVENOUS

## 2015-03-29 MED ORDER — MIDAZOLAM HCL 2 MG/2ML IJ SOLN: 1.0000 mg | INTRAMUSCULAR | Status: DC | PRN

## 2015-03-29 SURGICAL SUPPLY — 44 items
BANDAGE ELASTIC 3 VELCRO ST LF (GAUZE/BANDAGES/DRESSINGS) ×2 IMPLANT
BLADE CARPAL TUNNEL SNGL USE (BLADE) IMPLANT
BLADE SURG 15 STRL LF DISP TIS (BLADE) ×2 IMPLANT
BLADE SURG 15 STRL SS (BLADE) ×2
BNDG CONFORM 3 STRL LF (GAUZE/BANDAGES/DRESSINGS) ×2 IMPLANT
BRUSH SCRUB EZ PLAIN DRY (MISCELLANEOUS) ×2 IMPLANT
CORDS BIPOLAR (ELECTRODE) ×2 IMPLANT
COVER BACK TABLE 60X90IN (DRAPES) ×2 IMPLANT
CUFF TOURNIQUET SINGLE 18IN (TOURNIQUET CUFF) IMPLANT
DRAIN PENROSE 1/4X12 LTX STRL (WOUND CARE) IMPLANT
DRAPE EXTREMITY T 121X128X90 (DRAPE) ×2 IMPLANT
DRAPE SURG 17X23 STRL (DRAPES) ×2 IMPLANT
DRSG EMULSION OIL 3X3 NADH (GAUZE/BANDAGES/DRESSINGS) ×2 IMPLANT
GAUZE SPONGE 4X4 12PLY STRL (GAUZE/BANDAGES/DRESSINGS) IMPLANT
GAUZE SPONGE 4X4 16PLY XRAY LF (GAUZE/BANDAGES/DRESSINGS) IMPLANT
GAUZE XEROFORM 1X8 LF (GAUZE/BANDAGES/DRESSINGS) ×2 IMPLANT
GLOVE BIOGEL M STRL SZ7.5 (GLOVE) IMPLANT
GLOVE SS BIOGEL STRL SZ 8 (GLOVE) ×1 IMPLANT
GLOVE SUPERSENSE BIOGEL SZ 8 (GLOVE) ×1
GOWN STRL REUS W/ TWL LRG LVL3 (GOWN DISPOSABLE) ×1 IMPLANT
GOWN STRL REUS W/ TWL XL LVL3 (GOWN DISPOSABLE) ×1 IMPLANT
GOWN STRL REUS W/TWL LRG LVL3 (GOWN DISPOSABLE) ×1
GOWN STRL REUS W/TWL XL LVL3 (GOWN DISPOSABLE) ×1
LOOP VESSEL MAXI BLUE (MISCELLANEOUS) IMPLANT
NDL SAFETY ECLIPSE 18X1.5 (NEEDLE) ×1 IMPLANT
NEEDLE HYPO 18GX1.5 SHARP (NEEDLE) ×1
NEEDLE HYPO 22GX1.5 SAFETY (NEEDLE) IMPLANT
NEEDLE HYPO 25X1 1.5 SAFETY (NEEDLE) ×4 IMPLANT
NS IRRIG 1000ML POUR BTL (IV SOLUTION) ×2 IMPLANT
PACK BASIN DAY SURGERY FS (CUSTOM PROCEDURE TRAY) ×2 IMPLANT
PAD ALCOHOL SWAB (MISCELLANEOUS) ×16 IMPLANT
PAD CAST 3X4 CTTN HI CHSV (CAST SUPPLIES) ×2 IMPLANT
PADDING CAST ABS 4INX4YD NS (CAST SUPPLIES) ×1
PADDING CAST ABS COTTON 4X4 ST (CAST SUPPLIES) ×1 IMPLANT
PADDING CAST COTTON 3X4 STRL (CAST SUPPLIES) ×2
SHEET MEDIUM DRAPE 40X70 STRL (DRAPES) ×2 IMPLANT
STOCKINETTE 4X48 STRL (DRAPES) ×2 IMPLANT
SUT PROLENE 4 0 PS 2 18 (SUTURE) IMPLANT
SYR BULB 3OZ (MISCELLANEOUS) ×2 IMPLANT
SYR CONTROL 10ML LL (SYRINGE) ×4 IMPLANT
TOWEL OR 17X24 6PK STRL BLUE (TOWEL DISPOSABLE) ×2 IMPLANT
TOWEL OR NON WOVEN STRL DISP B (DISPOSABLE) ×2 IMPLANT
TRAY DSU PREP LF (CUSTOM PROCEDURE TRAY) ×2 IMPLANT
UNDERPAD 30X30 (UNDERPADS AND DIAPERS) ×2 IMPLANT

## 2015-03-29 NOTE — Anesthesia Preprocedure Evaluation (Addendum)
Anesthesia Evaluation  Patient identified by MRN, date of birth, ID band Patient awake    Reviewed: Allergy & Precautions, NPO status , Patient's Chart, lab work & pertinent test results  Airway Mallampati: I  TM Distance: >3 FB Neck ROM: Full    Dental  (+) Teeth Intact, Dental Advisory Given   Pulmonary asthma , former smoker,    breath sounds clear to auscultation       Cardiovascular  Rhythm:Regular Rate:Normal     Neuro/Psych    GI/Hepatic   Endo/Other  diabetes, Well Controlled, Type 2, Insulin DependentHypothyroidism Morbid obesity  Renal/GU      Musculoskeletal   Abdominal   Peds  Hematology   Anesthesia Other Findings Left foot is reddened presumably from a needle puncture performed by the pt in past 24 hours to "drain" a lesion on his Left 4th toe.  Reproductive/Obstetrics                           Anesthesia Physical Anesthesia Plan  ASA: III  Anesthesia Plan: MAC   Post-op Pain Management:    Induction: Intravenous  Airway Management Planned: Simple Face Mask  Additional Equipment:   Intra-op Plan:   Post-operative Plan:   Informed Consent: I have reviewed the patients History and Physical, chart, labs and discussed the procedure including the risks, benefits and alternatives for the proposed anesthesia with the patient or authorized representative who has indicated his/her understanding and acceptance.   Dental advisory given  Plan Discussed with: CRNA, Anesthesiologist and Surgeon  Anesthesia Plan Comments:         Anesthesia Quick Evaluation

## 2015-03-29 NOTE — Anesthesia Postprocedure Evaluation (Signed)
  Anesthesia Post-op Note  Patient: Eduardo Long  Procedure(s) Performed: Procedure(s): RIGHT CARPAL TUNNEL RELEASE (Right)  Patient Location: PACU  Anesthesia Type: MAC   Level of Consciousness: awake, alert  and oriented  Airway and Oxygen Therapy: Patient Spontanous Breathing  Post-op Pain: mild  Post-op Assessment: Post-op Vital signs reviewed  Post-op Vital Signs: Reviewed  Last Vitals:  Filed Vitals:   03/29/15 1030  BP: 127/78  Pulse: 94  Temp: 36.8 C  Resp: 18    Complications: No apparent anesthesia complications

## 2015-03-29 NOTE — ED Notes (Signed)
Pt is in stable condition upon d/c and is escorted from ED via wheelchair. 

## 2015-03-29 NOTE — ED Notes (Addendum)
Pt states he was at the [facility] today for carpal tunnel surgery on his right arm/hand. Pt states the surgeon saw his toe on his left foot next to the great toe and felt like it was infected so, he sent him down. Pt states the surgeon was able to squeeze out purulent drainage and sent a culture.

## 2015-03-29 NOTE — Discharge Instructions (Signed)
Take your prescriptions as prescribed. Follow-up with your primary care provider in 4 days. Return to the emergency department if symptoms worsen or new onset of fever, chills, difficulty breathing, cough, chest pain, abdominal pain, drainage from wound, numbness, tingling, weakness.

## 2015-03-29 NOTE — ED Provider Notes (Signed)
CSN: [id number]     Arrival date & time 03/29/15  1027 History   First MD Initiated Contact with Patient 03/29/15 1028     No chief complaint on file.    (Consider location/radiation/quality/duration/timing/severity/associated sxs/prior Treatment) HPI Comments: Patient is a 59 year old male with past medical history of diabetes who presents to the ED with complaint of toe infection, onset 2 days. Patient reports he noticed a small bump/boil to his left fourth toe a few days ago and states he has had mild pain associated with walking or wearing boots. Denies fever, chills, HA, SOB, cough, CP, abdominal pain, N/V/D, urinary sxs, numbness, tingling, weakness. He notes yesterday noticing increased redness and swelling extending from his toe to his ankle. He reports cleaning the wound with alcohol and salt and notes he tried to "pop" the bump with a sterilized needle at home but was unable to produce any drainage. Patient states he was scheduled to have carpal tunnel surgery on his right wrist today by Dr. Amedeo Plenty, however once his toe was examined by Dr. Amedeo Plenty he was sent to the ED for suspected infection and possible need for IV antibiotics/admission. Pt reports the surgeon was able to squeeze out purulent drainage and wound cultures were obtained.   Past Medical History  Diagnosis Date  . Diabetes mellitus without complication ([place])   . Asthma   . Hypothyroidism    Past Surgical History  Procedure Laterality Date  . Colonoscopy    . Left carpal tunnel    . Sinus endo with fusion Bilateral 03/15/2015    Procedure: ENDOSCOPIC SINUS SURGERY WITH FUSION;  Surgeon: Melida Quitter, MD;  Location: [facility];  Service: ENT;  Laterality: Bilateral;   History reviewed. No pertinent family history. Social History  Substance Use Topics  . Smoking status: Former Smoker    Types: Cigarettes  . Smokeless tobacco: None     Comment: Smoked off and on nothing recent  . Alcohol Use: No     Review of Systems  Musculoskeletal:       Swelling of left foot  Skin: Positive for color change (redness) and wound.  All other systems reviewed and are negative.     Allergies  Review of patient's allergies indicates no known allergies.  Home Medications   Prior to Admission medications   Medication Sig Start Date End Date Taking? Authorizing Provider  albuterol (PROVENTIL HFA;VENTOLIN HFA) 108 (90 BASE) MCG/ACT inhaler Inhale into the lungs every 6 (six) hours as needed for wheezing or shortness of breath.   Yes Historical Provider, MD  albuterol (PROVENTIL) (5 MG/ML) 0.5% nebulizer solution Take 2.5 mg by nebulization every 6 (six) hours as needed for wheezing or shortness of breath.   Yes Historical Provider, MD  Ascorbic Acid (VITAMIN C) 100 MG tablet Take 100 mg by mouth daily.   Yes Historical Provider, MD  atorvastatin (LIPITOR) 40 MG tablet Take 40 mg by mouth daily.   Yes Historical Provider, MD  HYDROcodone-acetaminophen (NORCO/VICODIN) 5-325 MG tablet Take 1 tablet by mouth every 6 (six) hours as needed for moderate pain. 03/15/15  Yes Melida Quitter, MD  Insulin Aspart (NOVOLOG [place]) Inject into the skin. 25-30 UNITS THREE TIMES A DAY - SLIDING SCALE   Yes Historical Provider, MD  levothyroxine (SYNTHROID, LEVOTHROID) 200 MCG tablet Take 200 mcg by mouth daily before breakfast.   Yes Historical Provider, MD  predniSONE (DELTASONE) 20 MG tablet Take 20 mg by mouth daily with breakfast. 3 tabs times 3 days, 2  tabs times 2 days, then 1 tab times 3   Yes Historical Provider, MD  pyridOXINE (VITAMIN B-6) 100 MG tablet Take 100 mg by mouth daily.   Yes Historical Provider, MD   BP 108/68 mmHg  Pulse 88  Temp(Src) 97.8 F (36.6 C) (Oral)  Resp 20  Ht 5\' 10"  (1.778 m)  Wt 237 lb (107.502 kg)  BMI 34.01 kg/m2  SpO2 99% Physical Exam  Constitutional: He is oriented to person, place, and time. He appears well-developed and well-nourished.  HENT:  Head: Normocephalic and  atraumatic.  Eyes: Conjunctivae and EOM are normal. Right eye exhibits no discharge. Left eye exhibits no discharge. No scleral icterus.  Neck: Normal range of motion. Neck supple.  Cardiovascular: Normal rate, regular rhythm, normal heart sounds and intact distal pulses.   No murmur heard. Pulmonary/Chest: Effort normal and breath sounds normal. No respiratory distress. He has no wheezes. He has no rales. He exhibits no tenderness.  Abdominal: Soft. Bowel sounds are normal. He exhibits no distension and no mass. There is no tenderness. There is no rebound and no guarding.  Musculoskeletal: Normal range of motion. He exhibits tenderness. He exhibits no edema.       Left foot: There is tenderness and swelling. There is normal range of motion, no bony tenderness, normal capillary refill, no crepitus, no deformity and no laceration.       Feet:  Neurological: He is alert and oriented to person, place, and time.  Skin: Skin is warm and dry.  Small lesion noted to volar surface of left 4th toe with surrounding erythema, warmth and swelling extending up to mid calf, TTP, no drainage, no induration or fluctuance.  Nursing note and vitals reviewed.   ED Course  Procedures (including critical care time) Labs Review Labs Reviewed  ANAEROBIC CULTURE  WOUND CULTURE  GLUCOSE, CAPILLARY    Imaging Review No results found. I have personally reviewed and evaluated these images and lab results as part of my medical decision-making.   MDM   Final diagnoses:  Cellulitis of left lower extremity    Patient presents with left toe wound. History of diabetes on insulin. Patient reports worsening redness, swelling, tenderness to left fourth toe extending to his ankle. Patient was scheduled to have right carpal tunnel surgery today. Patient reported left toe wound to Dr. Amedeo Plenty who examined the wound. He was able to produce a small amount of pus from the wound and obtained wound cultures. I spoke with Dr.  Amedeo Plenty on the phone who reported that the patient needed to be evaluated and tx in the ED for diabetic foot wound for IV antibiotics.   VSS. Exam revealed small lesion noted to the left fourth toe with surrounding erythema, warmth and swelling extending up to his mid calf, no evidence of abscess. Presentation consistent with cellulitis. Patient given IV clindamycin in the ED. Wound cultures pending. Plan to discharge patient home with antibiotics. Advised patient to follow up with his PCP.  Evaluation does not show pathology requring ongoing emergent intervention or admission. Pt is hemodynamically stable and mentating appropriately. Discussed findings/results and plan with patient/guardian, who agrees with plan. All questions answered. Return precautions discussed and outpatient follow up given.     Chesley Noon [place], [place] 03/29/15 1238  Virgel Manifold, MD 04/07/15 2117

## 2015-03-29 NOTE — H&P (Signed)
  Patient is scheduled for carpal tunnel surgery. Patient relates to me that he was having some discomfort in his left foot.  His left foot has an area of cellulitis going up to the midcalf as well as a small lesion over the fourth toe. We cultured the fourth toe with a rope week and anaerobic culture.  I discussed with Stanis[place] that I would recommend evaluation in the emergency room with admission IV antibiotics and other measures. I discussed him the nature of the infections in diabetics and the need for meticulous care to prevent loss of toe or limb.  We discussed these issues at length.  I will cancel his surgery for his upper extremity and we will transfer him to the emergency room.  I did call the emergency room and discussed this with the triage nurse as well as Dr. Wilson Singer  Cultures were sent from [facility] day surgery.  On my office reschedule him at  convenient time once his foot is stable  Nazaret Chea M.D.

## 2015-03-29 NOTE — Telephone Encounter (Signed)
Error

## 2015-04-01 ENCOUNTER — Encounter (HOSPITAL_BASED_OUTPATIENT_CLINIC_OR_DEPARTMENT_OTHER): Payer: Self-pay | Admitting: Orthopedic Surgery

## 2015-04-02 ENCOUNTER — Ambulatory Visit: Payer: Self-pay | Admitting: Surgery

## 2015-04-06 ENCOUNTER — Emergency Department (HOSPITAL_COMMUNITY): Payer: BLUE CROSS/BLUE SHIELD

## 2015-04-06 ENCOUNTER — Encounter (HOSPITAL_COMMUNITY): Payer: Self-pay | Admitting: *Deleted

## 2015-04-06 ENCOUNTER — Inpatient Hospital Stay (HOSPITAL_COMMUNITY)
Admission: EM | Admit: 2015-04-06 | Discharge: 2015-04-13 | DRG: 602 | Disposition: A | Discharge status: Home / Self Care | Payer: BLUE CROSS/BLUE SHIELD | Attending: Internal Medicine | Admitting: Internal Medicine

## 2015-04-06 DIAGNOSIS — E1165 Type 2 diabetes mellitus with hyperglycemia: Secondary | ICD-10-CM | POA: Diagnosis present

## 2015-04-06 DIAGNOSIS — Z833 Family history of diabetes mellitus: Secondary | ICD-10-CM

## 2015-04-06 DIAGNOSIS — Z794 Long term (current) use of insulin: Secondary | ICD-10-CM

## 2015-04-06 DIAGNOSIS — E039 Hypothyroidism, unspecified: Secondary | ICD-10-CM | POA: Diagnosis not present

## 2015-04-06 DIAGNOSIS — I5031 Acute diastolic (congestive) heart failure: Secondary | ICD-10-CM | POA: Diagnosis not present

## 2015-04-06 DIAGNOSIS — R0600 Dyspnea, unspecified: Secondary | ICD-10-CM | POA: Diagnosis not present

## 2015-04-06 DIAGNOSIS — D649 Anemia, unspecified: Secondary | ICD-10-CM | POA: Diagnosis present

## 2015-04-06 DIAGNOSIS — R600 Localized edema: Secondary | ICD-10-CM

## 2015-04-06 DIAGNOSIS — Z7982 Long term (current) use of aspirin: Secondary | ICD-10-CM

## 2015-04-06 DIAGNOSIS — Z823 Family history of stroke: Secondary | ICD-10-CM

## 2015-04-06 DIAGNOSIS — Z87891 Personal history of nicotine dependence: Secondary | ICD-10-CM

## 2015-04-06 DIAGNOSIS — Z8249 Family history of ischemic heart disease and other diseases of the circulatory system: Secondary | ICD-10-CM

## 2015-04-06 DIAGNOSIS — L03116 Cellulitis of left lower limb: Secondary | ICD-10-CM | POA: Diagnosis not present

## 2015-04-06 DIAGNOSIS — E785 Hyperlipidemia, unspecified: Secondary | ICD-10-CM | POA: Diagnosis present

## 2015-04-06 DIAGNOSIS — J45909 Unspecified asthma, uncomplicated: Secondary | ICD-10-CM | POA: Diagnosis present

## 2015-04-06 DIAGNOSIS — E119 Type 2 diabetes mellitus without complications: Secondary | ICD-10-CM | POA: Diagnosis not present

## 2015-04-06 DIAGNOSIS — M79672 Pain in left foot: Secondary | ICD-10-CM | POA: Diagnosis not present

## 2015-04-06 DIAGNOSIS — E11649 Type 2 diabetes mellitus with hypoglycemia without coma: Secondary | ICD-10-CM | POA: Diagnosis present

## 2015-04-06 DIAGNOSIS — T380X5A Adverse effect of glucocorticoids and synthetic analogues, initial encounter: Secondary | ICD-10-CM | POA: Diagnosis present

## 2015-04-06 DIAGNOSIS — N179 Acute kidney failure, unspecified: Secondary | ICD-10-CM

## 2015-04-06 DIAGNOSIS — R609 Edema, unspecified: Secondary | ICD-10-CM

## 2015-04-06 DIAGNOSIS — E871 Hypo-osmolality and hyponatremia: Secondary | ICD-10-CM | POA: Diagnosis present

## 2015-04-06 DIAGNOSIS — IMO0001 Reserved for inherently not codable concepts without codable children: Secondary | ICD-10-CM

## 2015-04-06 LAB — GLUCOSE, CAPILLARY: Glucose-Capillary: 323 mg/dL — ABNORMAL HIGH (ref 65–99)

## 2015-04-06 LAB — COMPREHENSIVE METABOLIC PANEL
ALT: 60 U/L (ref 17–63)
AST: 48 U/L — ABNORMAL HIGH (ref 15–41)
Albumin: 3.4 g/dL — ABNORMAL LOW (ref 3.5–5.0)
Alkaline Phosphatase: 85 U/L (ref 38–126)
Anion gap: 7 (ref 5–15)
BUN: 16 mg/dL (ref 6–20)
CO2: 30 mmol/L (ref 22–32)
Calcium: 8.8 mg/dL — ABNORMAL LOW (ref 8.9–10.3)
Chloride: 102 mmol/L (ref 101–111)
Creatinine, Ser: 1.25 mg/dL — ABNORMAL HIGH (ref 0.61–1.24)
GFR calc Af Amer: >60 mL/min (ref >60)
GFR calc non Af Amer: >60 mL/min (ref >60)
Glucose, Bld: 114 mg/dL — ABNORMAL HIGH (ref 65–99)
Potassium: 4.1 mmol/L (ref 3.5–5.1)
Sodium: 139 mmol/L (ref 135–145)
Total Bilirubin: 0.4 mg/dL (ref 0.3–1.2)
Total Protein: 6.2 g/dL — ABNORMAL LOW (ref 6.5–8.1)

## 2015-04-06 LAB — URINALYSIS, ROUTINE W REFLEX MICROSCOPIC
Bilirubin Urine: NEGATIVE
Glucose, UA: NEGATIVE mg/dL
Hgb urine dipstick: NEGATIVE
Ketones, ur: NEGATIVE mg/dL
Leukocytes, UA: NEGATIVE
Nitrite: NEGATIVE
Protein, ur: NEGATIVE mg/dL
Specific Gravity, Urine: 1.015 (ref 1.005–1.030)
Urobilinogen, UA: 0.2 mg/dL (ref 0.0–1.0)
pH: 7 (ref 5.0–8.0)

## 2015-04-06 LAB — BRAIN NATRIURETIC PEPTIDE: B Natriuretic Peptide: 50.5 pg/mL (ref 0.0–100.0)

## 2015-04-06 LAB — FOLATE: Folate: 14.3 ng/mL (ref >5.9)

## 2015-04-06 LAB — CBC WITH DIFFERENTIAL/PLATELET
Basophils Absolute: 0 10*3/uL (ref 0.0–0.1)
Basophils Relative: 1 %
Eosinophils Absolute: 0.4 10*3/uL (ref 0.0–0.7)
Eosinophils Relative: 6 %
HCT: 38.4 % — ABNORMAL LOW (ref 39.0–52.0)
Hemoglobin: 12.6 g/dL — ABNORMAL LOW (ref 13.0–17.0)
Lymphocytes Relative: 31 %
Lymphs Abs: 2 10*3/uL (ref 0.7–4.0)
MCH: 30.8 pg (ref 26.0–34.0)
MCHC: 32.8 g/dL (ref 30.0–36.0)
MCV: 93.9 fL (ref 78.0–100.0)
Monocytes Absolute: 0.6 10*3/uL (ref 0.1–1.0)
Monocytes Relative: 10 %
Neutro Abs: 3.6 10*3/uL (ref 1.7–7.7)
Neutrophils Relative %: 54 %
Platelets: 380 10*3/uL (ref 150–400)
RBC: 4.09 MIL/uL — ABNORMAL LOW (ref 4.22–5.81)
RDW: 14.4 % (ref 11.5–15.5)
WBC: 6.6 10*3/uL (ref 4.0–10.5)

## 2015-04-06 LAB — IRON AND TIBC
Iron: 49 ug/dL (ref 45–182)
Saturation Ratios: 12 % — ABNORMAL LOW (ref 17.9–39.5)
TIBC: 399 ug/dL (ref 250–450)
UIBC: 350 ug/dL

## 2015-04-06 LAB — VITAMIN B12: Vitamin B-12: 387 pg/mL (ref 180–914)

## 2015-04-06 LAB — FERRITIN: Ferritin: 31 ng/mL (ref 24–336)

## 2015-04-06 LAB — I-STAT CG4 LACTIC ACID, ED
Lactic Acid, Venous: 2.04 mmol/L (ref 0.5–2.0)
Lactic Acid, Venous: 1.73 mmol/L (ref 0.5–2.0)

## 2015-04-06 LAB — TSH: TSH: 1.039 u[IU]/mL (ref 0.350–4.500)

## 2015-04-06 MED ORDER — MOMETASONE FURO-FORMOTEROL FUM 100-5 MCG/ACT IN AERO
2.0000 | INHALATION_SPRAY | Freq: Two times a day (BID) | RESPIRATORY_TRACT | Status: DC
  Administered 2015-04-06 – 2015-04-13 (×13): 2 via RESPIRATORY_TRACT
  Filled 2015-04-06: qty 8.8

## 2015-04-06 MED ORDER — LEVOTHYROXINE SODIUM 100 MCG PO TABS
200.0000 ug | ORAL_TABLET | Freq: Every day | ORAL | Status: DC
  Administered 2015-04-07 – 2015-04-13 (×7): 200 ug via ORAL
  Filled 2015-04-06 (×8): qty 2

## 2015-04-06 MED ORDER — VANCOMYCIN HCL 10 G IV SOLR
2000.0000 mg | INTRAVENOUS | Status: AC
  Administered 2015-04-06: 2000 mg via INTRAVENOUS
  Filled 2015-04-06: qty 2000

## 2015-04-06 MED ORDER — ATORVASTATIN CALCIUM 40 MG PO TABS
40.0000 mg | ORAL_TABLET | Freq: Every day | ORAL | Status: DC
Start: 2015-04-07 — End: 2015-04-13
  Administered 2015-04-07 – 2015-04-13 (×7): 40 mg via ORAL
  Filled 2015-04-06 (×7): qty 1

## 2015-04-06 MED ORDER — INSULIN ASPART 100 UNIT/ML ~~LOC~~ SOLN
10.0000 [IU] | Freq: Three times a day (TID) | SUBCUTANEOUS | Status: DC
  Administered 2015-04-07: 10 [IU] via SUBCUTANEOUS

## 2015-04-06 MED ORDER — PIPERACILLIN-TAZOBACTAM 3.375 G IVPB 30 MIN
3.3750 g | Freq: Once | INTRAVENOUS | Status: AC
  Administered 2015-04-06: 3.375 g via INTRAVENOUS
  Filled 2015-04-06: qty 50

## 2015-04-06 MED ORDER — SODIUM CHLORIDE 0.9 % IV BOLUS (SEPSIS)
1000.0000 mL | Freq: Once | INTRAVENOUS | Status: AC
  Administered 2015-04-06: 1000 mL via INTRAVENOUS

## 2015-04-06 MED ORDER — VANCOMYCIN HCL IN DEXTROSE 1-5 GM/200ML-% IV SOLN: 1000.0000 mg | Freq: Once | INTRAVENOUS | Status: DC

## 2015-04-06 MED ORDER — PIPERACILLIN-TAZOBACTAM 3.375 G IVPB
3.3750 g | Freq: Three times a day (TID) | INTRAVENOUS | Status: DC
  Administered 2015-04-07 – 2015-04-11 (×14): 3.375 g via INTRAVENOUS
  Filled 2015-04-06 (×16): qty 50

## 2015-04-06 MED ORDER — ONDANSETRON HCL 4 MG/2ML IJ SOLN
4.0000 mg | Freq: Once | INTRAMUSCULAR | Status: AC
  Administered 2015-04-06: 4 mg via INTRAVENOUS
  Filled 2015-04-06: qty 2

## 2015-04-06 MED ORDER — VANCOMYCIN HCL IN DEXTROSE 750-5 MG/150ML-% IV SOLN
750.0000 mg | Freq: Two times a day (BID) | INTRAVENOUS | Status: DC
  Administered 2015-04-07 – 2015-04-09 (×6): 750 mg via INTRAVENOUS
  Filled 2015-04-06 (×7): qty 150

## 2015-04-06 MED ORDER — SODIUM CHLORIDE 0.9 % IV SOLN: INTRAVENOUS | Status: DC

## 2015-04-06 MED ORDER — ZOLPIDEM TARTRATE 5 MG PO TABS
10.0000 mg | ORAL_TABLET | Freq: Every evening | ORAL | Status: DC | PRN
  Administered 2015-04-06 – 2015-04-12 (×7): 10 mg via ORAL
  Filled 2015-04-06 (×7): qty 2

## 2015-04-06 MED ORDER — INSULIN DETEMIR 100 UNIT/ML ~~LOC~~ SOLN
10.0000 [IU] | Freq: Every day | SUBCUTANEOUS | Status: DC
Start: 2015-04-06 — End: 2015-04-07
  Administered 2015-04-06: 10 [IU] via SUBCUTANEOUS
  Filled 2015-04-06 (×2): qty 0.1

## 2015-04-06 MED ORDER — ENOXAPARIN SODIUM 40 MG/0.4ML ~~LOC~~ SOLN
40.0000 mg | SUBCUTANEOUS | Status: DC
  Administered 2015-04-06 – 2015-04-07 (×2): 40 mg via SUBCUTANEOUS
  Filled 2015-04-06 (×2): qty 0.4

## 2015-04-06 MED ORDER — INSULIN ASPART 100 UNIT/ML ~~LOC~~ SOLN
0.0000 [IU] | Freq: Every day | SUBCUTANEOUS | Status: DC
  Administered 2015-04-06: 4 [IU] via SUBCUTANEOUS
  Administered 2015-04-09: 5 [IU] via SUBCUTANEOUS
  Administered 2015-04-10: 3 [IU] via SUBCUTANEOUS
  Administered 2015-04-11: 2 [IU] via SUBCUTANEOUS

## 2015-04-06 MED ORDER — HYDROMORPHONE HCL 1 MG/ML IJ SOLN
0.5000 mg | Freq: Once | INTRAMUSCULAR | Status: AC
  Administered 2015-04-06: 0.5 mg via INTRAVENOUS
  Filled 2015-04-06: qty 1

## 2015-04-06 MED ORDER — IPRATROPIUM-ALBUTEROL 0.5-2.5 (3) MG/3ML IN SOLN
3.0000 mL | Freq: Two times a day (BID) | RESPIRATORY_TRACT | Status: DC
  Administered 2015-04-06 – 2015-04-13 (×14): 3 mL via RESPIRATORY_TRACT
  Filled 2015-04-06 (×15): qty 3

## 2015-04-06 MED ORDER — INSULIN ASPART 100 UNIT/ML ~~LOC~~ SOLN
0.0000 [IU] | Freq: Three times a day (TID) | SUBCUTANEOUS | Status: DC
  Administered 2015-04-07: 15 [IU] via SUBCUTANEOUS
  Administered 2015-04-07: 5 [IU] via SUBCUTANEOUS
  Administered 2015-04-07: 15 [IU] via SUBCUTANEOUS
  Administered 2015-04-08: 5 [IU] via SUBCUTANEOUS
  Administered 2015-04-08: 11 [IU] via SUBCUTANEOUS
  Administered 2015-04-09: 5 [IU] via SUBCUTANEOUS
  Administered 2015-04-09: 13 [IU] via SUBCUTANEOUS
  Administered 2015-04-10: 2 [IU] via SUBCUTANEOUS
  Administered 2015-04-10: 3 [IU] via SUBCUTANEOUS
  Administered 2015-04-11: 2 [IU] via SUBCUTANEOUS
  Administered 2015-04-11: 15 [IU] via SUBCUTANEOUS
  Administered 2015-04-12: 3 [IU] via SUBCUTANEOUS
  Administered 2015-04-12: 15 [IU] via SUBCUTANEOUS
  Administered 2015-04-13: 3 [IU] via SUBCUTANEOUS

## 2015-04-06 NOTE — ED Notes (Signed)
The pt has had pain and swelling on his lt foot redness and swelling since then.  Getting worse for the past several dsays.  He has been on antibiotics  x2  Not getting better

## 2015-04-06 NOTE — Progress Notes (Signed)
ANTIBIOTIC CONSULT NOTE - INITIAL  Pharmacy Consult for Vancomycin + Zosyn Indication: Empiric cellulitis coverage  No Known Allergies  Patient Measurements: Height: 5' 10.5" (179.1 cm) Weight: 250 lb 5 oz (113.541 kg) IBW/kg (Calculated) : 74.15  Vital Signs: Temp: 98.4 F (36.9 C) (11/05 1509) BP: 128/67 mmHg (11/05 1715) Pulse Rate: 88 (11/05 1715) Intake/Output from previous day:   Intake/Output from this shift:    Labs:  Recent Labs  04/06/15 1608  WBC 6.6  HGB 12.6*  PLT 380  CREATININE 1.25*   Estimated Creatinine Clearance: 78.9 mL/min (by C-G formula based on Cr of 1.25). No results for input(s): VANCOTROUGH, VANCOPEAK, VANCORANDOM, GENTTROUGH, GENTPEAK, GENTRANDOM, TOBRATROUGH, TOBRAPEAK, TOBRARND, AMIKACINPEAK, AMIKACINTROU, AMIKACIN in the last 72 hours.   Microbiology: No results found for this or any previous visit (from the past 720 hour(s)).  Medical History: Past Medical History  Diagnosis Date  . Diabetes mellitus without complication ([place])   . Asthma   . Hypothyroidism     Assessment: 65 YOM who presented on 10/28 with LLE cellulitis - wound cultures were taken by ortho at the office. The patient was sent home from the ED at that time on oral Clindamycin >> then transitioned to Augmentin + Bactrim.   The patient represented on 11/5 with worsening cellulitis with fevers/chills. Pharmacy now consulted to start empiric Vancomycin + Zosyn for coverage. Wt: 113.5 kg, SCr 1.25, CrCl~60-70 ml/min (normalized)  Goal of Therapy:  Vancomycin trough level 10-15 mcg/ml  Plan:  1. Vancomycin 2g IV x 1 dose to load followed by 750 mg IV every 12 hours 2. Zosyn 3.375g IV every 8 hours (4 hr infusion) 3. Will continue to follow renal function, culture results, LOT, and antibiotic de-escalation plans   Alycia Rossetti, PharmD, BCPS Clinical Pharmacist Pager: [phone] 04/06/2015 5:30 PM

## 2015-04-06 NOTE — ED Provider Notes (Signed)
CSN: [id number]     Arrival date & time 04/06/15  1431 History   First MD Initiated Contact with Patient 04/06/15 1631     Chief Complaint  Patient presents with  . Cellulitis     (Consider location/radiation/quality/duration/timing/severity/associated sxs/prior Treatment) The history is provided by the patient and a relative.  Patient w hx iddm. c/o pain and swelling to left foot in the past couple weeks. Moderate. Constant. Dull. Has been on abx x 2 without resolution. Denies fever or chills. Was initially on clindamycin 10/26, then switched to augmentin/bactrim 3 days ago.  Pt notes persistent pain, swelling and redness to left foot and lower leg. Notes 20 lb weight increase. Chills. No fevers.  Saw pcp today and was referred to ED for admission for iv abx.     Past Medical History  Diagnosis Date  . Diabetes mellitus without complication ([place])   . Asthma   . Hypothyroidism    Past Surgical History  Procedure Laterality Date  . Colonoscopy    . Left carpal tunnel    . Sinus endo with fusion Bilateral 03/15/2015    Procedure: ENDOSCOPIC SINUS SURGERY WITH FUSION;  Surgeon: Melida Quitter, MD;  Location: [facility];  Service: ENT;  Laterality: Bilateral;  . Carpal tunnel release Right 03/29/2015    Procedure: RIGHT CARPAL TUNNEL RELEASE;  Surgeon: Roseanne Kaufman, MD;  Location: [facility];  Service: Orthopedics;  Laterality: Right;   No family history on file. Social History  Substance Use Topics  . Smoking status: Former Smoker    Types: Cigarettes  . Smokeless tobacco: None     Comment: Smoked off and on nothing recent  . Alcohol Use: No    Review of Systems  Constitutional: Positive for chills. Negative for fever.  HENT: Negative for sore throat.   Eyes: Negative for redness.  Respiratory: Negative for shortness of breath.   Cardiovascular: Negative for chest pain.  Gastrointestinal: Negative for vomiting, abdominal pain and diarrhea.   Endocrine: Negative for polyuria.  Genitourinary: Negative for dysuria and flank pain.  Musculoskeletal: Negative for back pain and neck pain.  Skin: Negative for rash.  Neurological: Negative for headaches.  Hematological: Does not bruise/bleed easily.  Psychiatric/Behavioral: Negative for confusion.      Allergies  Review of patient's allergies indicates no known allergies.  Home Medications   Prior to Admission medications   Medication Sig Start Date End Date Taking? Authorizing Provider  albuterol (PROAIR HFA) 108 (90 BASE) MCG/ACT inhaler Inhale 2 puffs into the lungs every 6 (six) hours as needed for wheezing or shortness of breath.   Yes Historical Provider, MD  albuterol (PROVENTIL) (2.5 MG/3ML) 0.083% nebulizer solution Take 2.5 mg by nebulization every 6 (six) hours as needed for wheezing or shortness of breath.   Yes Historical Provider, MD  amoxicillin-clavulanate (AUGMENTIN) 875-125 MG tablet Take 1 tablet by mouth 2 (two) times daily. 14 day course filled 04/03/15 04/03/15  Yes Historical Provider, MD  aspirin EC 325 MG tablet Take 650-975 mg by mouth 2 (two) times daily as needed (pain).   Yes Historical Provider, MD  atorvastatin (LIPITOR) 40 MG tablet Take 40 mg by mouth daily.   Yes Historical Provider, MD  insulin aspart (NOVOLOG) 100 UNIT/ML injection Inject 40-60 Units into the skin 2 (two) times daily with a meal. Inject 60 units subcutaneously with breakfast and 40-50 units with supper   Yes Historical Provider, MD  levothyroxine (SYNTHROID, LEVOTHROID) 200 MCG tablet Take 200 mcg  by mouth daily before breakfast.   Yes Historical Provider, MD  OVER THE COUNTER MEDICATION Take 1 tablet by mouth at bedtime. Vitamin B6 or Vitamin B12 - OTC   Yes Historical Provider, MD  sulfamethoxazole-trimethoprim (BACTRIM DS,SEPTRA DS) 800-160 MG tablet Take 1 tablet by mouth 2 (two) times daily. 14 day course filled 04/03/15 04/03/15  Yes Historical Provider, MD  vitamin C (ASCORBIC  ACID) 500 MG tablet Take 500-1,000 mg by mouth at bedtime.   Yes Historical Provider, MD  zolpidem (AMBIEN) 10 MG tablet Take 10 mg by mouth at bedtime as needed for sleep.  03/21/15  Yes Historical Provider, MD  albuterol (PROVENTIL HFA;VENTOLIN HFA) 108 (90 BASE) MCG/ACT inhaler Inhale into the lungs every 6 (six) hours as needed for wheezing or shortness of breath.    Historical Provider, MD  albuterol (PROVENTIL) (5 MG/ML) 0.5% nebulizer solution Take 2.5 mg by nebulization every 6 (six) hours as needed for wheezing or shortness of breath.    Historical Provider, MD  Ascorbic Acid (VITAMIN C) 100 MG tablet Take 100 mg by mouth daily.    Historical Provider, MD  HYDROcodone-acetaminophen (NORCO/VICODIN) 5-325 MG tablet Take 1 tablet by mouth every 6 (six) hours as needed for moderate pain. Patient not taking: Reported on 04/06/2015 03/15/15   Melida Quitter, MD  pyridOXINE (VITAMIN B-6) 100 MG tablet Take 100 mg by mouth daily.    Historical Provider, MD   BP 121/71 mmHg  Pulse 92  Temp(Src) 98.4 F (36.9 C)  Resp 18  Ht 5' 10.5" (1.791 m)  Wt 250 lb 5 oz (113.541 kg)  BMI 35.40 kg/m2  SpO2 96% Physical Exam  Constitutional: He is oriented to person, place, and time. He appears well-developed and well-nourished. No distress.  HENT:  Mouth/Throat: Oropharynx is clear and moist.  Eyes: Conjunctivae are normal. No scleral icterus.  Neck: Neck supple. No tracheal deviation present.  Cardiovascular: Normal rate, regular rhythm, normal heart sounds and intact distal pulses.   Pulmonary/Chest: Effort normal and breath sounds normal. No accessory muscle usage. No respiratory distress.  Abdominal: Soft. Bowel sounds are normal. He exhibits no distension. There is no tenderness.  Genitourinary:  No cva tenderness  Musculoskeletal: Normal range of motion. He exhibits no edema or tenderness.  Cellulitis left foot and lower leg. Also w increased warmth and erythema to right lower leg and right mid  thigh.  No crepitus. No necrotic or devitalized tissue.    Neurological: He is alert and oriented to person, place, and time.  Skin: Skin is warm and dry. No rash noted. He is not diaphoretic.  Psychiatric: He has a normal mood and affect.  Nursing note and vitals reviewed.   ED Course  Procedures (including critical care time) Labs Review    Results for orders placed or performed during the hospital encounter of 04/06/15  Comprehensive metabolic panel  Result Value Ref Range   Sodium 139 135 - 145 mmol/L   Potassium 4.1 3.5 - 5.1 mmol/L   Chloride 102 101 - 111 mmol/L   CO2 30 22 - 32 mmol/L   Glucose, Bld 114 (H) 65 - 99 mg/dL   BUN 16 6 - 20 mg/dL   Creatinine, Ser 1.25 (H) 0.61 - 1.24 mg/dL   Calcium 8.8 (L) 8.9 - 10.3 mg/dL   Total Protein 6.2 (L) 6.5 - 8.1 g/dL   Albumin 3.4 (L) 3.5 - 5.0 g/dL   AST 48 (H) 15 - 41 U/L   ALT 60 17 -  63 U/L   Alkaline Phosphatase 85 38 - 126 U/L   Total Bilirubin 0.4 0.3 - 1.2 mg/dL   GFR calc non Af Amer >60 >60 mL/min   GFR calc Af Amer >60 >60 mL/min   Anion gap 7 5 - 15  CBC with Differential  Result Value Ref Range   WBC 6.6 4.0 - 10.5 K/uL   RBC 4.09 (L) 4.22 - 5.81 MIL/uL   Hemoglobin 12.6 (L) 13.0 - 17.0 g/dL   HCT 38.4 (L) 39.0 - 52.0 %   MCV 93.9 78.0 - 100.0 fL   MCH 30.8 26.0 - 34.0 pg   MCHC 32.8 30.0 - 36.0 g/dL   RDW 14.4 11.5 - 15.5 %   Platelets 380 150 - 400 K/uL   Neutrophils Relative % 54 %   Neutro Abs 3.6 1.7 - 7.7 K/uL   Lymphocytes Relative 31 %   Lymphs Abs 2.0 0.7 - 4.0 K/uL   Monocytes Relative 10 %   Monocytes Absolute 0.6 0.1 - 1.0 K/uL   Eosinophils Relative 6 %   Eosinophils Absolute 0.4 0.0 - 0.7 K/uL   Basophils Relative 1 %   Basophils Absolute 0.0 0.0 - 0.1 K/uL  Urinalysis, Routine w reflex microscopic (not at [facility])  Result Value Ref Range   Color, Urine YELLOW YELLOW   APPearance CLEAR CLEAR   Specific Gravity, Urine 1.015 1.005 - 1.030   pH 7.0 5.0 - 8.0   Glucose, UA NEGATIVE  NEGATIVE mg/dL   Hgb urine dipstick NEGATIVE NEGATIVE   Bilirubin Urine NEGATIVE NEGATIVE   Ketones, ur NEGATIVE NEGATIVE mg/dL   Protein, ur NEGATIVE NEGATIVE mg/dL   Urobilinogen, UA 0.2 0.0 - 1.0 mg/dL   Nitrite NEGATIVE NEGATIVE   Leukocytes, UA NEGATIVE NEGATIVE  I-Stat CG4 Lactic Acid, ED (Not at [facility])  Result Value Ref Range   Lactic Acid, Venous 2.04 (HH) 0.5 - 2.0 mmol/L   Comment NOTIFIED PHYSICIAN    Dg Chest 2 View  04/06/2015  CLINICAL DATA:  59 year old male with a history of swelling EXAM: CHEST - 2 VIEW COMPARISON:  04/29/2005 FINDINGS: Cardiomediastinal silhouette projects within normal limits in size and contour. No confluent airspace disease, pneumothorax, or pleural effusion. No displaced fracture. Unremarkable appearance of the upper abdomen. IMPRESSION: No radiographic evidence of acute cardiopulmonary disease. Signed, Dulcy Fanny. Earleen Newport, DO [facility] Radiology Electronically Signed   By: Corrie Mckusick D.O.   On: 04/06/2015 16:07   Ct Head W Wo Contrast  03/13/2015  CLINICAL DATA:  Scalp lipomas. EXAM: CT HEAD WITHOUT AND WITH CONTRAST TECHNIQUE: Contiguous axial images were obtained from the base of the skull through the vertex without and with intravenous contrast CONTRAST:  79mL ISOVUE-300 IOPAMIDOL (ISOVUE-300) INJECTION 61% COMPARISON:  None. FINDINGS: A lipoma in the left posterior occipital scalp measures 5.8 x 1.6 x 5.5 cm. There are several septations within this lipoma. No enhancing soft tissue is present. A 1.4 cm right frontal scalp lipoma is also present. No other focal lipomatous lesions are present. Scalp is otherwise within normal limits. A fluid level is present in the left maxillary sinus. There is scattered fluid throughout the ethmoid air cells bilaterally. There is some mucosal thickening into the inferior frontal sinuses bilaterally. The mastoid air cells are clear. The calvarium is intact. Mild physiologic  calcifications are present within the dentate nuclei bilaterally. No acute infarct, hemorrhage, or mass lesion is present. The ventricles are of normal size. The basal ganglia are intact.  The cortex is unremarkable. No significant extra-axial fluid collection is present. The postcontrast images demonstrate no pathologic enhancement. IMPRESSION: 1. 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 There no enhancing lesions to suggest malignancy. Recommend continued clinical all observation for any growth of this leak and. 2. More simple appearing lipoma in the anterior right frontal scalp. 3. Normal CT appearance of the brain for age. Electronically Signed   By: San Morelle M.D.   On: 03/13/2015 14:03      I have personally reviewed and evaluated these images and lab results as part of my medical decision-making.   EKG Interpretation   Date/Time:  Saturday April 06 2015 15:20:11 EDT Ventricular Rate:  96 PR Interval:  132 QRS Duration: 134 QT Interval:  390 QTC Calculation: 492 R Axis:   108 Text Interpretation:  Normal sinus rhythm Right bundle branch block No  significant change since last tracing Confirmed by Ashok Cordia  MD, Lennette Bihari  ([id number]) on 04/06/2015 4:34:15 PM      MDM   Labs.  Iv ns bolus.  Reviewed nursing notes and prior charts for additional history.   Iv abx. Iv ns bolus.    Reviewed noted from [facility] pcp - indicates recent neg xrays foot for osteo, and rec admit/iv abx.  Cultures sent.   Iv abx ordered.   Hospitalists paged for admission.       Lajean Saver, MD 04/06/15 [phone]

## 2015-04-06 NOTE — Progress Notes (Signed)
Pt admitted to the unit at1850. Pt mental status is alert and oriented x 4. Pt oriented to room, staff, and call bell. Skin is good except for cellulitis on Left Leg/foot. Call bell within reach. Visitor guidelines reviewed w/ pt and/or family.

## 2015-04-06 NOTE — H&P (Addendum)
[facility] History and Physical  Eduardo Long AJO:[id number] DOB: 1956/03/05 DOA: 04/06/2015  Referring physician:  Lajean Saver PCP:  Orpah Melter, MD   Chief Complaint:  Leg swelling  HPI:  The patient is a 59 y.o. year-old male with history of diabetes mellitus type 2, asthma, hypothyroidism who presents with left foot and leg swelling and pain.  About 9 days prior to admission, he noticed a blood blister on the fourth toe of the left foot which he attributed to an ill-fitting pair of shoes.  He was seen the next day by a physician at which time his toe and forefoot had swelled some.  He was sent to the hospital for evaluation where he had pus squeezed out of his toe.  He was given a dose of antibiotics in the ER via IV and sent home with a prescription for clindamycin which he took for 4-5 days.  Although his foot hurt when the pus was drained, he states he has not had much pain, however, his foot and leg became increasingly red and swollen.  He was seen about 4 days prior to admission by his PCP who changed his antibiotics from clinda to bactrim + augmentin which he has been taking for the last few days.  He was seen again by his PCP two days later and told that his foot looked worse and that he should come to the ER.  He has not had fevers, chills, nausea, vomiting, or other systemic symptoms.  He states he has had increasing wheezing, dyspnea, lower extremity edema over the last month.  He has gained 30-lbs in teh last month which he attributes to his prednisone.  He denies chest pressure, however, his wife states that he complained of chest tightness to her previously.  He denies PND and orthopnea.    Ihe ER, VSS, WBC 6.6, lactic acid 2.  His creatinine is 1.25 with previous baseline of 1 and he is mildly anemic.  He had bilateral lower extremity swelling worse on the left foot with evidence of cellulitis of the foot, toes, and lower leg.  He was started on vancomycin and zosyn and  admitted for cellulitis which failed outpatient therapy.    Review of Systems:  General:  Denies fevers, chills, weight loss.  + 30-lb weight gain over the last month HEENT:  Denies changes to hearing and vision, rhinorrhea, sinus congestion, sore throat CV:  Denies chest pain and palpitations, lower extremity edema.  PULM:  + SOB, wheezing, cough.   GI:  Denies nausea, vomiting, constipation, diarrhea.   GU:  Denies dysuria, frequency, urgency ENDO:  Denies polyuria, polydipsia.   HEME:  Denies hematemesis, blood in stools, melena, abnormal bruising or bleeding.  LYMPH:  Denies lymphadenopathy.   MSK:  Denies arthralgias, myalgias.   DERM:  Per HPI    NEURO:  Denies focal numbness, weakness, slurred speech, confusion, facial droop.  PSYCH:  Denies anxiety and depression.    Past Medical History  Diagnosis Date  . Diabetes mellitus without complication ([place])   . Asthma   . Hypothyroidism    Past Surgical History  Procedure Laterality Date  . Colonoscopy    . Left carpal tunnel    . Sinus endo with fusion Bilateral 03/15/2015    Procedure: ENDOSCOPIC SINUS SURGERY WITH FUSION;  Surgeon: Melida Quitter, MD;  Location: [place];  Service: ENT;  Laterality: Bilateral;  . Carpal tunnel release Right 03/29/2015    Procedure: RIGHT CARPAL TUNNEL RELEASE;  Surgeon: Roseanne Kaufman, MD;  Location: [place];  Service: Orthopedics;  Laterality: Right;   Social History:  reports that he has quit smoking. His smoking use included Cigarettes. He does not have any smokeless tobacco history on file. He reports that he does not drink alcohol or use illicit drugs.  No Known Allergies  Family History  Problem Relation Age of Onset  . Diabetes Mellitus II Brother   . Deep vein thrombosis Neg Hx   . Valvular heart disease Mother   . Stroke Mother      Prior to Admission medications   Medication Sig Start Date End Date Taking? Authorizing Provider  albuterol  (PROAIR HFA) 108 (90 BASE) MCG/ACT inhaler Inhale 2 puffs into the lungs every 6 (six) hours as needed for wheezing or shortness of breath.   Yes Historical Provider, MD  albuterol (PROVENTIL) (2.5 MG/3ML) 0.083% nebulizer solution Take 2.5 mg by nebulization every 6 (six) hours as needed for wheezing or shortness of breath.   Yes Historical Provider, MD  amoxicillin-clavulanate (AUGMENTIN) 875-125 MG tablet Take 1 tablet by mouth 2 (two) times daily. 14 day course filled 04/03/15 04/03/15  Yes Historical Provider, MD  aspirin EC 325 MG tablet Take 650-975 mg by mouth 2 (two) times daily as needed (pain).   Yes Historical Provider, MD  atorvastatin (LIPITOR) 40 MG tablet Take 40 mg by mouth daily.   Yes Historical Provider, MD  insulin aspart (NOVOLOG) 100 UNIT/ML injection Inject 40-60 Units into the skin 2 (two) times daily with a meal. Inject 60 units subcutaneously with breakfast and 40-50 units with supper   Yes Historical Provider, MD  levothyroxine (SYNTHROID, LEVOTHROID) 200 MCG tablet Take 200 mcg by mouth daily before breakfast.   Yes Historical Provider, MD  OVER THE COUNTER MEDICATION Take 1 tablet by mouth at bedtime. Vitamin B6 or Vitamin B12 - OTC   Yes Historical Provider, MD  sulfamethoxazole-trimethoprim (BACTRIM DS,SEPTRA DS) 800-160 MG tablet Take 1 tablet by mouth 2 (two) times daily. 14 day course filled 04/03/15 04/03/15  Yes Historical Provider, MD  vitamin C (ASCORBIC ACID) 500 MG tablet Take 500-1,000 mg by mouth at bedtime.   Yes Historical Provider, MD  zolpidem (AMBIEN) 10 MG tablet Take 10 mg by mouth at bedtime as needed for sleep.  03/21/15  Yes Historical Provider, MD  HYDROcodone-acetaminophen (NORCO/VICODIN) 5-325 MG tablet Take 1 tablet by mouth every 6 (six) hours as needed for moderate pain. Patient not taking: Reported on 04/06/2015 03/15/15   Melida Quitter, MD   Physical Exam: Filed Vitals:   04/06/15 1815 04/06/15 1830 04/06/15 1854 04/06/15 1856  BP: 150/80 127/68  142/82   Pulse: 96 91 91   Temp:   98.7 F (37.1 C)   TempSrc:   Oral   Resp: 22  19   Height:    5\' 10"  (1.778 m)  Weight:    113.399 kg (250 lb)  SpO2: 97% 94% 94%      General:  Adult male, NAD  Eyes:  PERRL, anicteric, non-injected.  ENT:  Nares clear.  OP clear, non-erythematous without plaques or exudates.  MMM.  Neck:  Supple without TM or JVD.    Lymph:  No cervical, supraclavicular, or submandibular LAD.  Cardiovascular:  RRR, normal S1, S2, without m/r/g.  2+ pulses, warm extremities  Respiratory:  Diminished bilateral BS without wheezes, rales, or rhonchi.  Becomes SOB with heavy breathingi when he turns over on the stretcher or sits up for exam.  Abdomen:  NABS.  Soft, moderately distended, nontender  Skin:  toes with dry ulcer of the IP joint of the left fourth toe and hot pink erythema of the toes and forefoot.  He has injection of the entire left lower leg up to the knee.    Musculoskeletal:  Normal bulk and tone.  2-3+ pitting edema of the left lower extremity and 2+ pitting edema of the right lower extremity.    Psychiatric:  A & O x 4.  Appropriate affect.  Neurologic:  CN 3-12 intact.  5/5 strength.  Sensation intact.  Labs on Admission:  Basic Metabolic Panel:  Recent Labs Lab 04/06/15 1608  NA 139  K 4.1  CL 102  CO2 30  GLUCOSE 114*  BUN 16  CREATININE 1.25*  CALCIUM 8.8*   Liver Function Tests:  Recent Labs Lab 04/06/15 1608  AST 48*  ALT 60  ALKPHOS 85  BILITOT 0.4  PROT 6.2*  ALBUMIN 3.4*   No results for input(s): LIPASE, AMYLASE in the last 168 hours. No results for input(s): AMMONIA in the last 168 hours. CBC:  Recent Labs Lab 04/06/15 1608  WBC 6.6  NEUTROABS 3.6  HGB 12.6*  HCT 38.4*  MCV 93.9  PLT 380   Cardiac Enzymes: No results for input(s): CKTOTAL, CKMB, CKMBINDEX, TROPONINI in the last 168 hours.  BNP (last 3 results) No results for input(s): BNP in the last 8760 hours.  ProBNP (last 3  results) No results for input(s): PROBNP in the last 8760 hours.  CBG: No results for input(s): GLUCAP in the last 168 hours.  Radiological Exams on Admission: Dg Chest 2 View  04/06/2015  CLINICAL DATA:  59 year old male with a history of swelling EXAM: CHEST - 2 VIEW COMPARISON:  04/29/2005 FINDINGS: Cardiomediastinal silhouette projects within normal limits in size and contour. No confluent airspace disease, pneumothorax, or pleural effusion. No displaced fracture. Unremarkable appearance of the upper abdomen. IMPRESSION: No radiographic evidence of acute cardiopulmonary disease. Signed, Dulcy Fanny. Earleen Newport, DO [facility] Electronically Signed   By: Corrie Mckusick D.O.   On: 04/06/2015 16:07    EKG: Independently reviewed.  NSR with RBBB, stable from prior  Assessment/Plan Active Problems:   IDDM (insulin dependent diabetes mellitus) (HCC)   Cellulitis of leg, left   Bilateral lower extremity edema   Dyspnea   Hypothyroidism  ---  Left lower extremity cellulitis that failed outpatient management without evidence of sepsis, present at time of admission -  Likely caused by uncontrolled diabetes:  Check A1c -  HIV PCR -  F/u cultures -  Continue vancomycin and zosyn  Bilateral lower extremity edema concerning for acute diastolic heart failure -  ECHO -  UA to eval for proteinuria -  TSH -  TED hose -  Elevate legs -  Defer lasix for now due to mildly elevated lactic acid -  Duplex lower extremities  Dyspnea, CXR without evidence of effusion or vascular congestion per report.  Also not particularly hyperinflated -  ECHO -  BNP -  Start dulera and duonebs -  Outpatient PFTs  Diabetes mellitus type 2, uncontrolled.   -  A1c  -  Start levemir 10 units QUS -  Aspart 10 units with meals with moderate dose SSI and HS insulin  Hyperlipidemia, stable, continue atorvastatin  Hypothyroidism -  Check TSH -  Continue  synthroid  Normocytic anemia -  Iron studies, B12, folate -  TSH -  Occult stool -  Repeat hgb in AM  Diet:  diabetic Access:  PIV IVF:  Off  Proph:  lovenox  Code Status: full Family Communication: patient and briefly to his wife Disposition Plan: Admit to med-surg  Time spent: 60 min Janece Canterbury [facility] Pager [phone]  If 7PM-7AM, please contact night-coverage [email or web address] Password [email or web address] 04/06/2015, 6:59 PM

## 2015-04-06 NOTE — ED Notes (Signed)
Admit MD at bedside

## 2015-04-06 NOTE — ED Notes (Signed)
The pt has had some sob.  The leg is swollen from just below the knee down toi his entire lower leg

## 2015-04-07 ENCOUNTER — Observation Stay (HOSPITAL_BASED_OUTPATIENT_CLINIC_OR_DEPARTMENT_OTHER): Payer: BLUE CROSS/BLUE SHIELD

## 2015-04-07 ENCOUNTER — Observation Stay (HOSPITAL_COMMUNITY): Payer: BLUE CROSS/BLUE SHIELD

## 2015-04-07 DIAGNOSIS — R609 Edema, unspecified: Secondary | ICD-10-CM | POA: Diagnosis not present

## 2015-04-07 DIAGNOSIS — L03116 Cellulitis of left lower limb: Secondary | ICD-10-CM | POA: Diagnosis present

## 2015-04-07 DIAGNOSIS — Z8249 Family history of ischemic heart disease and other diseases of the circulatory system: Secondary | ICD-10-CM | POA: Diagnosis not present

## 2015-04-07 DIAGNOSIS — I5031 Acute diastolic (congestive) heart failure: Secondary | ICD-10-CM | POA: Diagnosis not present

## 2015-04-07 DIAGNOSIS — M79672 Pain in left foot: Secondary | ICD-10-CM | POA: Diagnosis present

## 2015-04-07 DIAGNOSIS — R0600 Dyspnea, unspecified: Secondary | ICD-10-CM

## 2015-04-07 DIAGNOSIS — E785 Hyperlipidemia, unspecified: Secondary | ICD-10-CM | POA: Diagnosis present

## 2015-04-07 DIAGNOSIS — Z87891 Personal history of nicotine dependence: Secondary | ICD-10-CM | POA: Diagnosis not present

## 2015-04-07 DIAGNOSIS — Z823 Family history of stroke: Secondary | ICD-10-CM | POA: Diagnosis not present

## 2015-04-07 DIAGNOSIS — E038 Other specified hypothyroidism: Secondary | ICD-10-CM | POA: Diagnosis not present

## 2015-04-07 DIAGNOSIS — E119 Type 2 diabetes mellitus without complications: Secondary | ICD-10-CM | POA: Diagnosis not present

## 2015-04-07 DIAGNOSIS — Z833 Family history of diabetes mellitus: Secondary | ICD-10-CM | POA: Diagnosis not present

## 2015-04-07 DIAGNOSIS — T380X5A Adverse effect of glucocorticoids and synthetic analogues, initial encounter: Secondary | ICD-10-CM | POA: Diagnosis present

## 2015-04-07 DIAGNOSIS — Z7982 Long term (current) use of aspirin: Secondary | ICD-10-CM | POA: Diagnosis not present

## 2015-04-07 DIAGNOSIS — E039 Hypothyroidism, unspecified: Secondary | ICD-10-CM | POA: Diagnosis present

## 2015-04-07 DIAGNOSIS — E11649 Type 2 diabetes mellitus with hypoglycemia without coma: Secondary | ICD-10-CM | POA: Diagnosis present

## 2015-04-07 DIAGNOSIS — E1165 Type 2 diabetes mellitus with hyperglycemia: Secondary | ICD-10-CM | POA: Diagnosis present

## 2015-04-07 DIAGNOSIS — J45909 Unspecified asthma, uncomplicated: Secondary | ICD-10-CM | POA: Diagnosis present

## 2015-04-07 DIAGNOSIS — Z794 Long term (current) use of insulin: Secondary | ICD-10-CM | POA: Diagnosis not present

## 2015-04-07 DIAGNOSIS — R600 Localized edema: Secondary | ICD-10-CM | POA: Diagnosis not present

## 2015-04-07 DIAGNOSIS — E871 Hypo-osmolality and hyponatremia: Secondary | ICD-10-CM | POA: Diagnosis present

## 2015-04-07 DIAGNOSIS — D649 Anemia, unspecified: Secondary | ICD-10-CM | POA: Diagnosis present

## 2015-04-07 LAB — GLUCOSE, CAPILLARY
Glucose-Capillary: 389 mg/dL — ABNORMAL HIGH (ref 65–99)
Glucose-Capillary: 220 mg/dL — ABNORMAL HIGH (ref 65–99)
Glucose-Capillary: 359 mg/dL — ABNORMAL HIGH (ref 65–99)
Glucose-Capillary: 42 mg/dL — CL (ref 65–99)
Glucose-Capillary: 105 mg/dL — ABNORMAL HIGH (ref 65–99)

## 2015-04-07 LAB — CBC
HCT: 35.6 % — ABNORMAL LOW (ref 39.0–52.0)
Hemoglobin: 11.3 g/dL — ABNORMAL LOW (ref 13.0–17.0)
MCH: 30.6 pg (ref 26.0–34.0)
MCHC: 31.7 g/dL (ref 30.0–36.0)
MCV: 96.5 fL (ref 78.0–100.0)
Platelets: 349 10*3/uL (ref 150–400)
RBC: 3.69 MIL/uL — ABNORMAL LOW (ref 4.22–5.81)
RDW: 14.8 % (ref 11.5–15.5)
WBC: 6.1 10*3/uL (ref 4.0–10.5)

## 2015-04-07 LAB — BASIC METABOLIC PANEL
Anion gap: 7 (ref 5–15)
BUN: 13 mg/dL (ref 6–20)
CO2: 28 mmol/L (ref 22–32)
Calcium: 8.5 mg/dL — ABNORMAL LOW (ref 8.9–10.3)
Chloride: 104 mmol/L (ref 101–111)
Creatinine, Ser: 1.04 mg/dL (ref 0.61–1.24)
GFR calc Af Amer: >60 mL/min (ref >60)
GFR calc non Af Amer: >60 mL/min (ref >60)
Glucose, Bld: 160 mg/dL — ABNORMAL HIGH (ref 65–99)
Potassium: 4.5 mmol/L (ref 3.5–5.1)
Sodium: 139 mmol/L (ref 135–145)

## 2015-04-07 LAB — URINE CULTURE: Culture: NO GROWTH

## 2015-04-07 LAB — HIV ANTIBODY (ROUTINE TESTING W REFLEX): HIV Screen 4th Generation wRfx: NONREACTIVE

## 2015-04-07 MED ORDER — INSULIN DETEMIR 100 UNIT/ML ~~LOC~~ SOLN
20.0000 [IU] | Freq: Two times a day (BID) | SUBCUTANEOUS | Status: DC
  Administered 2015-04-08: 20 [IU] via SUBCUTANEOUS
  Filled 2015-04-07 (×4): qty 0.2

## 2015-04-07 MED ORDER — FENTANYL CITRATE (PF) 100 MCG/2ML IJ SOLN: 25.0000 ug | INTRAMUSCULAR | Status: DC | PRN

## 2015-04-07 MED ORDER — FUROSEMIDE 40 MG PO TABS: 40.0000 mg | ORAL_TABLET | Freq: Every day | ORAL | Status: DC

## 2015-04-07 MED ORDER — OXYCODONE-ACETAMINOPHEN 5-325 MG PO TABS
2.0000 | ORAL_TABLET | Freq: Once | ORAL | Status: AC
  Administered 2015-04-07: 2 via ORAL
  Filled 2015-04-07: qty 2

## 2015-04-07 MED ORDER — ACETAMINOPHEN 325 MG PO TABS
650.0000 mg | ORAL_TABLET | Freq: Four times a day (QID) | ORAL | Status: DC | PRN
  Administered 2015-04-07 – 2015-04-12 (×8): 650 mg via ORAL
  Filled 2015-04-07 (×8): qty 2

## 2015-04-07 MED ORDER — MEPERIDINE HCL 25 MG/ML IJ SOLN: 6.2500 mg | INTRAMUSCULAR | Status: DC | PRN

## 2015-04-07 MED ORDER — FUROSEMIDE 10 MG/ML IJ SOLN
40.0000 mg | Freq: Two times a day (BID) | INTRAMUSCULAR | Status: DC
  Administered 2015-04-07 – 2015-04-10 (×7): 40 mg via INTRAVENOUS
  Filled 2015-04-07 (×7): qty 4

## 2015-04-07 MED ORDER — INSULIN ASPART 100 UNIT/ML ~~LOC~~ SOLN
20.0000 [IU] | Freq: Three times a day (TID) | SUBCUTANEOUS | Status: DC
  Administered 2015-04-07 (×2): 20 [IU] via SUBCUTANEOUS

## 2015-04-07 NOTE — Progress Notes (Signed)
VASCULAR LAB PRELIMINARY  PRELIMINARY  PRELIMINARY  PRELIMINARY  Bilateral lower extremity venous duplex completed.    Preliminary report:  There is no DVT or SVT noted in the bilateral lower extremities.   Franchesca Veneziano, RVT 04/07/2015, 12:06 PM

## 2015-04-07 NOTE — Progress Notes (Signed)
Pt  CBG 42  Very alert without any symptoms of hypoglyceamia gave him couple of crackers and orange juice CBG came up to 105, pt refused his schuled levemir with reason that he dont want his CBG to drop  physician on call was notified about pt refusal, i will continue to monitor

## 2015-04-07 NOTE — Progress Notes (Signed)
  Echocardiogram 2D Echocardiogram has been performed.  Eduardo Long 04/07/2015, 10:25 AM

## 2015-04-07 NOTE — Progress Notes (Signed)
[facility] PROGRESS NOTE  Eduardo Long EVO:[id number] DOB: [birth year] DOA: 04/06/2015 PCP: Orpah Melter, MD  Brief Summary  The patient is a 59 y.o. year-old male with history of diabetes mellitus type 2, asthma, hypothyroidism who presents with left foot and leg swelling and pain. About 9 days prior to admission, he noticed a blood blister on the fourth toe of the left foot which he attributed to an ill-fitting pair of shoes. He was seen the next day by a physician at which time his toe and forefoot had swelled some. He was sent to the hospital for evaluation where he had pus squeezed out of his toe. He was given a dose of antibiotics in the ER via IV and sent home with a prescription for clindamycin which he took for 4-5 days. Although his foot hurt when the pus was drained, he states he has not had much pain, however, his foot and leg became increasingly red and swollen. He was seen about 4 days prior to admission by his PCP who changed his antibiotics from clinda to bactrim + augmentin which he has been taking for the last few days. He was seen again by his PCP two days later and told that his foot looked worse and that he should come to the ER. He has not had fevers, chills, nausea, vomiting, or other systemic symptoms. He states he has had increasing wheezing, dyspnea, lower extremity edema over the last month. He has gained 30-lbs in teh last month which he attributes to his prednisone. He denies chest pressure, however, his wife states that he complained of chest tightness to her previously. He denies PND and orthopnea.   Ihe ER, VSS, WBC 6.6, lactic acid 2. His creatinine is 1.25 with previous baseline of 1 and he is mildly anemic. He had bilateral lower extremity swelling worse on the left foot with evidence of cellulitis of the foot, toes, and lower leg. He was started on vancomycin and zosyn and admitted for cellulitis which failed outpatient therapy.     Assessment/Plan  Left lower extremity cellulitis that failed outpatient management without evidence of sepsis, present at time of admission - A1c pending - HIV PCR pending - F/u cultures - Continue vancomycin and zosyn day 2  Bilateral lower extremity edema concerning for acute diastolic heart failure - ECHO pending - UA neg - TSH 1.039 wnl - TED hose - Elevate legs - Defer lasix for now due to mildly elevated lactic acid - Duplex lower extremities pending -  Start lasix 40 mg IV bid -  daily weights and strict I/O  Dyspnea, CXR without evidence of effusion or vascular congestion per report. Also not particularly hyperinflated - ECHO - BNP 50 (may be falsely low due to body habitus) - continue dulera and duonebs - Outpatient PFTs -  Outpatient sleep study   Weight gain, likely secondary to steroids recently -  Try to control breathing problems without pulse steroids -  Encouraged diet and exercise for weight loss  Diabetes mellitus type 2, uncontrolled. Patient states that he stopped taking his lantus 50  Units BID several months ago secondary to shortness of breath.  He read that SOB was a side effect of lantus insulin.  He stopped it and informed his endocrinology office that he had made that change.  Advised him that his breathing has only gotten worse and not better since stopping his insulin that he should resume his insulin and we should focus on what is actually causing his dyspnea.  Quite hyperglycemic this morning.   - A1c pending - Increase to levemir 20 units BID and continue to titrate as needed up to home dose of 50 units BID - increase to aspart 20 units with meals -  Continue moderate dose SSI and HS insulin  Hyperlipidemia, stable, continue atorvastatin  Hypothyroidism - TSH wnl - Continue synthroid  Normocytic anemia - Iron studies, B12, folate all wnl - TSH wnl - hgb stable  Diet: diabetic Access: PIV IVF: Off   Proph: lovenox  Code Status: full Family Communication: patient and briefly to his wife Disposition Plan:  Continue IV antibiotics and start lasix.  Await duplex and ECHO.  Home once showing signs of clinical improvement.     Consultants:  none  Procedures:  ECHO  Antibiotics:  vanco 11/5 >   Zosyn 11/5 >   HPI/Subjective:  States that his swelling remains about the same.  He feels that the shin/leg redness of both legs is better today because he has not been on his feet very much this morning.  Left forefoot and toes are swollen about the same as yesterday and he feels the redness may be a little less pink.  He had a headache this morning and a sweat that drenched the sheets after he was given some pain medication.    Objective: Filed Vitals:   04/07/15 0351 04/07/15 0602 04/07/15 0604 04/07/15 0833  BP: 139/76  132/77   Pulse: 100  100   Temp: 98 F (36.7 C)  98.1 F (36.7 C)   TempSrc: Oral  Oral   Resp: 18  18   Height:      Weight:  113.3 kg (249 lb 12.5 oz)    SpO2: 92%  91% 91%    Intake/Output Summary (Last 24 hours) at 04/07/15 1147 Last data filed at 04/07/15 0925  Gross per 24 hour  Intake    610 ml  Output   1225 ml  Net   -615 ml   Filed Weights   04/06/15 1509 04/06/15 1856 04/07/15 0602  Weight: 113.541 kg (250 lb 5 oz) 113.399 kg (250 lb) 113.3 kg (249 lb 12.5 oz)   Body mass index is 35.84 kg/(m^2).  Exam:   General:  Obese male, no acute distress.  He has mildly pressured speech and tends to repeat himself occasionally.    HEENT:  NCAT, MMM  Cardiovascular:  RRR, nl S1, S2 no mrg, 2+ pulses, warm extremities  Respiratory:  Diminished bilateral breath sounds, no focal rales, rhonchi, or wheeze, no increased WOB  Abdomen:   NABS, soft, NT/ND  MSK:   Normal tone and bulk, 2+ pitting edema of the left leg and 1+ of the right leg with evidence of stasis dermatitis.  Left forefoot with some swelling and erythema just over the MTP  joints. Fourth toe still has a dry yellow-crusted ulcer on the dorsum of the toe.    Neuro:  Grossly intact  Data Reviewed: Basic Metabolic Panel:  Recent Labs Lab 04/06/15 1608 04/07/15 0241  NA 139 139  K 4.1 4.5  CL 102 104  CO2 30 28  GLUCOSE 114* 160*  BUN 16 13  CREATININE 1.25* 1.04  CALCIUM 8.8* 8.5*   Liver Function Tests:  Recent Labs Lab 04/06/15 1608  AST 48*  ALT 60  ALKPHOS 85  BILITOT 0.4  PROT 6.2*  ALBUMIN 3.4*   No results for input(s): LIPASE, AMYLASE in the last 168 hours. No results for input(s): AMMONIA in the  last 168 hours. CBC:  Recent Labs Lab 04/06/15 1608 04/07/15 0241  WBC 6.6 6.1  NEUTROABS 3.6  --   HGB 12.6* 11.3*  HCT 38.4* 35.6*  MCV 93.9 96.5  PLT 380 349    Recent Results (from the past 240 hour(s))  Culture, blood (routine x 2)     Status: None (Preliminary result)   Collection Time: 04/06/15  3:38 PM  Result Value Ref Range Status   Specimen Description BLOOD LEFT ARM  Final   Special Requests   Final    BOTTLES DRAWN AEROBIC AND ANAEROBIC BLUE 10CC RED 5CC   Culture NO GROWTH < 24 HOURS  Final   Report Status PENDING  Incomplete  Urine culture     Status: None   Collection Time: 04/06/15  4:00 PM  Result Value Ref Range Status   Specimen Description URINE, CLEAN CATCH  Final   Special Requests NONE  Final   Culture NO GROWTH 1 DAY  Final   Report Status 04/07/2015 FINAL  Final  Culture, blood (routine x 2)     Status: None (Preliminary result)   Collection Time: 04/06/15  7:32 PM  Result Value Ref Range Status   Specimen Description BLOOD LEFT HAND  Final   Special Requests BOTTLES DRAWN AEROBIC AND ANAEROBIC 5CC  Final   Culture NO GROWTH < 24 HOURS  Final   Report Status PENDING  Incomplete     Studies: Dg Chest 2 View  04/06/2015  CLINICAL DATA:  60 year old male with a history of swelling EXAM: CHEST - 2 VIEW COMPARISON:  04/29/2005 FINDINGS: Cardiomediastinal silhouette projects within normal limits  in size and contour. No confluent airspace disease, pneumothorax, or pleural effusion. No displaced fracture. Unremarkable appearance of the upper abdomen. IMPRESSION: No radiographic evidence of acute cardiopulmonary disease. Signed, Dulcy Fanny. Earleen Newport, DO [facility] Radiology Electronically Signed   By: Corrie Mckusick D.O.   On: 04/06/2015 16:07    Scheduled Meds: . atorvastatin  40 mg Oral Daily  . enoxaparin (LOVENOX) injection  40 mg Subcutaneous Q24H  . insulin aspart  0-15 Units Subcutaneous TID WC  . insulin aspart  0-5 Units Subcutaneous QHS  . insulin aspart  20 Units Subcutaneous TID WC  . insulin detemir  20 Units Subcutaneous BID  . ipratropium-albuterol  3 mL Nebulization BID  . levothyroxine  200 mcg Oral QAC breakfast  . mometasone-formoterol  2 puff Inhalation BID  . piperacillin-tazobactam (ZOSYN)  IV  3.375 g Intravenous Q8H  . vancomycin  750 mg Intravenous Q12H   Continuous Infusions:   Active Problems:   IDDM (insulin dependent diabetes mellitus) ([place])   Cellulitis of leg, left   Bilateral lower extremity edema   Dyspnea   Hypothyroidism    Time spent: 30 min    Mishael Krysiak, [facility]  [facility] Pager [phone]. If 7PM-7AM, please contact night-coverage at [email or web address], password [facility] 04/07/2015, 11:47 AM

## 2015-04-08 LAB — BASIC METABOLIC PANEL
Anion gap: 12 (ref 5–15)
BUN: 14 mg/dL (ref 6–20)
CO2: 27 mmol/L (ref 22–32)
Calcium: 8.9 mg/dL (ref 8.9–10.3)
Chloride: 94 mmol/L — ABNORMAL LOW (ref 101–111)
Creatinine, Ser: 1.22 mg/dL (ref 0.61–1.24)
GFR calc Af Amer: >60 mL/min (ref >60)
GFR calc non Af Amer: >60 mL/min (ref >60)
Glucose, Bld: 441 mg/dL — ABNORMAL HIGH (ref 65–99)
Potassium: 4.2 mmol/L (ref 3.5–5.1)
Sodium: 133 mmol/L — ABNORMAL LOW (ref 135–145)

## 2015-04-08 LAB — CBC
HCT: 37.9 % — ABNORMAL LOW (ref 39.0–52.0)
Hemoglobin: 12.2 g/dL — ABNORMAL LOW (ref 13.0–17.0)
MCH: 30.7 pg (ref 26.0–34.0)
MCHC: 32.2 g/dL (ref 30.0–36.0)
MCV: 95.2 fL (ref 78.0–100.0)
Platelets: 395 10*3/uL (ref 150–400)
RBC: 3.98 MIL/uL — ABNORMAL LOW (ref 4.22–5.81)
RDW: 14.5 % (ref 11.5–15.5)
WBC: 6.7 10*3/uL (ref 4.0–10.5)

## 2015-04-08 LAB — GLUCOSE, CAPILLARY
Glucose-Capillary: 464 mg/dL — ABNORMAL HIGH (ref 65–99)
Glucose-Capillary: 320 mg/dL — ABNORMAL HIGH (ref 65–99)
Glucose-Capillary: 217 mg/dL — ABNORMAL HIGH (ref 65–99)
Glucose-Capillary: 101 mg/dL — ABNORMAL HIGH (ref 65–99)
Glucose-Capillary: 180 mg/dL — ABNORMAL HIGH (ref 65–99)

## 2015-04-08 LAB — HEMOGLOBIN A1C
Hgb A1c MFr Bld: 8.2 % — ABNORMAL HIGH (ref 4.8–5.6)
Mean Plasma Glucose: 189 mg/dL

## 2015-04-08 MED ORDER — INSULIN DETEMIR 100 UNIT/ML ~~LOC~~ SOLN: 20.0000 [IU] | Freq: Every day | SUBCUTANEOUS | Status: DC

## 2015-04-08 MED ORDER — INSULIN DETEMIR 100 UNIT/ML ~~LOC~~ SOLN
20.0000 [IU] | Freq: Two times a day (BID) | SUBCUTANEOUS | Status: DC
  Administered 2015-04-08 – 2015-04-12 (×8): 20 [IU] via SUBCUTANEOUS
  Filled 2015-04-08 (×9): qty 0.2

## 2015-04-08 MED ORDER — INSULIN ASPART 100 UNIT/ML ~~LOC~~ SOLN
4.0000 [IU] | Freq: Once | SUBCUTANEOUS | Status: AC
  Administered 2015-04-08: 4 [IU] via SUBCUTANEOUS

## 2015-04-08 MED ORDER — ENOXAPARIN SODIUM 60 MG/0.6ML ~~LOC~~ SOLN
0.5000 mg/kg | SUBCUTANEOUS | Status: DC
  Administered 2015-04-08 – 2015-04-12 (×5): 55 mg via SUBCUTANEOUS
  Filled 2015-04-08 (×5): qty 0.6

## 2015-04-08 MED ORDER — INSULIN ASPART 100 UNIT/ML ~~LOC~~ SOLN
10.0000 [IU] | Freq: Three times a day (TID) | SUBCUTANEOUS | Status: DC
  Administered 2015-04-08 – 2015-04-10 (×7): 10 [IU] via SUBCUTANEOUS

## 2015-04-08 NOTE — Progress Notes (Signed)
Inpatient Diabetes Program Recommendations  AACE/ADA: New Consensus Statement on Inpatient Glycemic Control (2015)  Target Ranges:  Prepandial:   less than 140 mg/dL      Peak postprandial:   less than 180 mg/dL (1-2 hours)      Critically ill patients:  140 - 180 mg/dL   Review of Glycemic Control  Long discussion with pt regarding his glycemic control. Pt states he checks his blood sugars and "estimates how much Novolog he needs." Pt states his blood sugars are "all over the place." Discussed keeping logbook and checking blood sugars at least 4x/day and taking to MD. Has had problems getting appt with endo and states he has appt in 2 weeks with her. Discussed hypoglycemia, diet, and exercise, and how these affect blood sugars. Pt willing to go to OP Diabetes Education. Will order same.   Will follow while inpatient. Thank you. Lorenda Peck, RD, LDN, CDE Inpatient Diabetes Coordinator [phone]

## 2015-04-08 NOTE — Progress Notes (Deleted)
Pharmacist Provided - Patient Medication Education Prior to Discharge   Eduardo Long is an 59 y.o. male who presented to [facility] on 04/06/2015 with a chief complaint of  Chief Complaint  Patient presents with  . Cellulitis     The following medications were discussed with the patient: Novolog 70/30, Metformin, Invokana  Diabetes Medications: [x]  Yes    []  No  Allergy Assessment Completed and Updated: [x]  Yes    []  No Identified Patient Allergies: No Known Allergies   Medication Adherence Assessment: []  Excellent (no doses missed/week)      []  Good (1 dose missed/week)      [x]  Partial (2-3 doses missed/week)      []  Poor (>3 doses missed/week)  Barriers to Obtaining Medications: []  Yes [x]  No  Assessment: Eduardo Long is a 59yo M admitted 04/06/2015 with cellulitis. We discussed at length the importance of getting his blood sugars under control. He states he often eats candy bars, ice cream and cookies late at night because it helps him "go to sleep at night". He also stated that he eats at a [place] almost every day. I discussed the importance of limiting his sugar intake and recommended trying to choose dishes without sauces and to replace soda with water. Eduardo Long was able to partially verbalize appropriate hypoglycemia management, this was reviewed with the patient. He is willing to try and adjust his diet to improve his diabetes.  The patient also expressed that he does not have a consistent sleep schedule and varies when he takes his insulin and other medications. I explained to him that it is important to identify a time he is always awake during each day. He states he will use an alarm to make this a priority upon discharge.  Time spent preparing for discharge counseling: 30 minutes Time spent counseling patient: 35 minutes  Dimitri Ped, PharmD. Clinical Pharmacist Resident Pager: [phone] 04/08/2015, 1:53 PM

## 2015-04-08 NOTE — Care Management Note (Signed)
Case Management Note  Patient Details  Name: Eduardo Long MRN: [id number] Date of Birth: 14-Jan-1956  Subjective/Objective:                  Date-04-08-15 Initial Assessment Spoke with patient at the bedside. Introduced self as Tourist information centre manager and explained role in discharge planning and how to be reached.  Verified patient lives in [place] in a house with wife.  Verified patient anticipates to go home with spouse, at time of discharge and will have full- time supervision by family  at this time to best of their knowledge.  Patient has DME cane walker  wheelchair nebulizer . Expressed potential need for no other DME.  Patient denied needing help with their medication.  Patient drives to MD appointments.  Verified patient has PCP. Still works, but is on [employer] currently. Patient states they currently receive [facility] services through no one.   Plan: CM will continue to follow for discharge planning and [facility] resources.   Carles Collet RN BSN CM [phone]   Action/Plan:   Expected Discharge Date:                  Expected Discharge Plan:  Home/Self Care  In-House Referral:     Discharge planning Services  CM Consult  Post Acute Care Choice:    Choice offered to:     DME Arranged:    DME Agency:     HH Arranged:    HH Agency:     Status of Service:  In process, will continue to follow  Medicare Important Message Given:    Date Medicare IM Given:    Medicare IM give by:    Date Additional Medicare IM Given:    Additional Medicare Important Message give by:     If discussed at [place] of Stay Meetings, dates discussed:    Additional Comments:  Carles Collet, RN 04/08/2015, 3:40 PM

## 2015-04-08 NOTE — Progress Notes (Signed)
Inpatient Diabetes Program Recommendations  AACE/ADA: New Consensus Statement on Inpatient Glycemic Control (2015)  Target Ranges:  Prepandial:   less than 140 mg/dL      Peak postprandial:   less than 180 mg/dL (1-2 hours)      Critically ill patients:  140 - 180 mg/dL   Review of Glycemic Control Results for HARGIS, VANDYNE (MRN [id number]) as of 04/08/2015 12:58  Ref. Range 04/06/2015 22:43 04/07/2015 08:08 04/07/2015 11:45 04/07/2015 16:26 04/07/2015 19:57 04/07/2015 21:49 04/08/2015 04:35 04/08/2015 07:45  Glucose-Capillary Latest Ref Range: 65-99 mg/dL 323 (H) 389 (H) 220 (H) 359 (H) 42 (LL) Had gotten high dose correction for cbg 359 mg/dL  105 (H) 464 (H) 320 (H)  Glucose running high presently requiring high doses of correction insulin which then may cause hypoglycemia.  Diabetes history: dm 2 Outpatient Diabetes medications: levemir 50 units bid and novolog (? Dose) Current orders for Inpatient glycemic control: Levemir 10 units-increased to 20 units to start tomorrow  Inpatient Diabetes Program Recommendations:    Pt would benefit from a basal regimen of 0.4 units/kg which is 40 units levemir and 10 units novolog meal coverage tidwc Can continue to increase levemir until fasting cbg's are controlled less than 150 mg/dL Please consider this Increase the basal and the meal coverage and reduce the correction to moderate tidwc and HS scale. Will talk with patient to further assess needs for glucose control.  Thank you Rosita Kea, RN, MSN, CDE  Diabetes Inpatient Program Office: [phone] Pager: [phone] 8:00 am to 5:00 pm

## 2015-04-08 NOTE — Progress Notes (Signed)
[facility] PROGRESS NOTE  Eduardo Long ZOX:[id number] DOB: [birth year] DOA: 04/06/2015 PCP: Orpah Melter, MD  Brief Summary  The patient is a 59 y.o. year-old male with history of diabetes mellitus type 2, asthma, hypothyroidism who presents with left foot and leg swelling and pain. About 9 days prior to admission, he noticed a blood blister on the fourth toe of the left foot which he attributed to an ill-fitting pair of shoes. He was seen the next day by a physician at which time his toe and forefoot had swelled some. He was sent to the hospital for evaluation where he had pus squeezed out of his toe. He was given a dose of antibiotics in the ER via IV and sent home with a prescription for clindamycin which he took for 4-5 days. Although his foot hurt when the pus was drained, he states he has not had much pain, however, his foot and leg became increasingly red and swollen. He was seen about 4 days prior to admission by his PCP who changed his antibiotics from clinda to bactrim + augmentin which he has been taking for the last few days. He was seen again by his PCP two days later and told that his foot looked worse and that he should come to the ER. He has not had fevers, chills, nausea, vomiting, or other systemic symptoms. He states he has had increasing wheezing, dyspnea, lower extremity edema over the last month. He has gained 30-lbs in teh last month which he attributes to his prednisone. He denies chest pressure, however, his wife states that he complained of chest tightness to her previously. He denies PND and orthopnea.   Ihe ER, VSS, WBC 6.6, lactic acid 2. His creatinine is 1.25 with previous baseline of 1 and he is mildly anemic. He had bilateral lower extremity swelling worse on the left foot with evidence of cellulitis of the foot, toes, and lower leg. He was started on vancomycin and zosyn and admitted for cellulitis which failed outpatient therapy.     Assessment/Plan  Left lower extremity cellulitis that failed outpatient management without evidence of sepsis, present at time of admission, slowly improving - A1c pending - HIV PCR NR - BCx NGTD - Continue vancomycin and zosyn day 3  Bilateral lower extremity edema concerning for acute diastolic heart failure, swelling improving -  -1.3L yesterday -  Wt down 3-kg - ECHO demonstrated mild LVH with EF 65-70% - UA neg - TSH 1.039 wnl - TED hose - Elevate legs - continue lasix 40mg  IV BID - Duplex lower extremities neg for DVT  Dyspnea, CXR without evidence of effusion or vascular congestion per report. Also not particularly hyperinflated, however.  Improving with diuresis - ECHO as above - continue dulera and duonebs - Outpatient PFTs -  Outpatient sleep study   Weight gain, likely secondary to steroids recently -  Try to control breathing problems without pulse steroids -  Encouraged diet and exercise for weight loss -  Nutrition consultation  Diabetes mellitus type 2, uncontrolled. Patient states that he stopped taking his lantus 50  Units BID several months ago secondary to shortness of breath.  He has also been making changes to his aspart insulin without the advice of his endocrinologist.  He snacks all day.  Had tried to adjust his insulin closer to his home regimen initially but he became hypoglycemic.   - A1c pending - Increase to levemir 20 units daily - Decrease to aspart 10 units with meals -  Continue moderate dose SSI and HS insulin -  Nutrition consult -  Diabetic educator consultation for education and assistance  Hyperlipidemia, stable, continue atorvastatin  Hypothyroidism - TSH wnl - Continue synthroid  Normocytic anemia - Iron studies, B12, folate all wnl - TSH wnl - hgb stable  Hyponatremia due to hyperglycemia > corrects to normal  Diet: diabetic Access: PIV IVF: Off  Proph: lovenox  Code Status:  full Family Communication: patient alone Disposition Plan:  Continue IV antibiotics and diuresis.  Home once showing signs of clinical improvement.     Consultants:  none  Procedures:  ECHO  Antibiotics:  vanco 11/5 >   Zosyn 11/5 >   HPI/Subjective:  Swelling in legs, abdomen, and flanks improving.  Less SOB today.   Left forefoot and toes still swollen and red.  Has limited understanding of his diabetes.    Objective: Filed Vitals:   04/07/15 2100 04/07/15 2156 04/08/15 0437 04/08/15 0820  BP:  125/66 141/70   Pulse:  98 102   Temp:  98.3 F (36.8 C) 97.9 F (36.6 C)   TempSrc:  Oral Oral   Resp:  18 18   Height:      Weight:   109.6 kg (241 lb 10 oz)   SpO2: 94% 97% 95% 95%    Intake/Output Summary (Last 24 hours) at 04/08/15 0825 Last data filed at 04/08/15 0714  Gross per 24 hour  Intake   1360 ml  Output   2975 ml  Net  -1615 ml   Filed Weights   04/06/15 1856 04/07/15 0602 04/08/15 0437  Weight: 113.399 kg (250 lb) 113.3 kg (249 lb 12.5 oz) 109.6 kg (241 lb 10 oz)   Body mass index is 34.67 kg/(m^2).  Exam:   General:  Obese male, no acute distress.  He has mildly pressured speech and tends to repeat himself.  Has difficulty answering questions directly.    HEENT:  NCAT, MMM  Cardiovascular:  RRR, nl S1, S2 no mrg, 2+ pulses, warm extremities  Respiratory:  Diminished bilateral breath sounds, no focal rales, rhonchi, or wheeze, no increased WOB  Abdomen:   NABS, soft, NT/ND, agree that skin feels less indurated or tense on flanks and abdomen  MSK:   Normal tone and bulk, 2+ pitting edema of the left leg and 1+ of the right leg with evidence of stasis dermatitis.  Left forefoot with some swelling and erythema just over the MTP joints. Fourth toe still has a dry yellow-crusted ulcer on the dorsum of the toe.    Neuro:  Grossly intact  Data Reviewed: Basic Metabolic Panel:  Recent Labs Lab 04/06/15 1608 04/07/15 0241 04/08/15 0533  NA  139 139 133*  K 4.1 4.5 4.2  CL 102 104 94*  CO2 30 28 27   GLUCOSE 114* 160* 441*  BUN 16 13 14   CREATININE 1.25* 1.04 1.22  CALCIUM 8.8* 8.5* 8.9   Liver Function Tests:  Recent Labs Lab 04/06/15 1608  AST 48*  ALT 60  ALKPHOS 85  BILITOT 0.4  PROT 6.2*  ALBUMIN 3.4*   No results for input(s): LIPASE, AMYLASE in the last 168 hours. No results for input(s): AMMONIA in the last 168 hours. CBC:  Recent Labs Lab 04/06/15 1608 04/07/15 0241 04/08/15 0533  WBC 6.6 6.1 6.7  NEUTROABS 3.6  --   --   HGB 12.6* 11.3* 12.2*  HCT 38.4* 35.6* 37.9*  MCV 93.9 96.5 95.2  PLT 380 349 395    Recent  Results (from the past 240 hour(s))  Culture, blood (routine x 2)     Status: None (Preliminary result)   Collection Time: 04/06/15  3:38 PM  Result Value Ref Range Status   Specimen Description BLOOD LEFT ARM  Final   Special Requests   Final    BOTTLES DRAWN AEROBIC AND ANAEROBIC BLUE 10CC RED 5CC   Culture NO GROWTH < 24 HOURS  Final   Report Status PENDING  Incomplete  Urine culture     Status: None   Collection Time: 04/06/15  4:00 PM  Result Value Ref Range Status   Specimen Description URINE, CLEAN CATCH  Final   Special Requests NONE  Final   Culture NO GROWTH 1 DAY  Final   Report Status 04/07/2015 FINAL  Final  Culture, blood (routine x 2)     Status: None (Preliminary result)   Collection Time: 04/06/15  7:32 PM  Result Value Ref Range Status   Specimen Description BLOOD LEFT HAND  Final   Special Requests BOTTLES DRAWN AEROBIC AND ANAEROBIC 5CC  Final   Culture NO GROWTH < 24 HOURS  Final   Report Status PENDING  Incomplete     Studies: Dg Chest 2 View  04/06/2015  CLINICAL DATA:  59 year old male with a history of swelling EXAM: CHEST - 2 VIEW COMPARISON:  04/29/2005 FINDINGS: Cardiomediastinal silhouette projects within normal limits in size and contour. No confluent airspace disease, pneumothorax, or pleural effusion. No displaced fracture. Unremarkable  appearance of the upper abdomen. IMPRESSION: No radiographic evidence of acute cardiopulmonary disease. Signed, Dulcy Fanny. Earleen Newport, DO [facility] Electronically Signed   By: Corrie Mckusick D.O.   On: 04/06/2015 16:07    Scheduled Meds: . atorvastatin  40 mg Oral Daily  . enoxaparin (LOVENOX) injection  40 mg Subcutaneous Q24H  . furosemide  40 mg Intravenous BID  . insulin aspart  0-15 Units Subcutaneous TID WC  . insulin aspart  0-5 Units Subcutaneous QHS  . insulin aspart  10 Units Subcutaneous TID WC  . [START ON 04/09/2015] insulin detemir  20 Units Subcutaneous Daily  . ipratropium-albuterol  3 mL Nebulization BID  . levothyroxine  200 mcg Oral QAC breakfast  . mometasone-formoterol  2 puff Inhalation BID  . piperacillin-tazobactam (ZOSYN)  IV  3.375 g Intravenous Q8H  . vancomycin  750 mg Intravenous Q12H   Continuous Infusions:   Active Problems:   IDDM (insulin dependent diabetes mellitus) ([place])   Cellulitis of leg, left   Bilateral lower extremity edema   Dyspnea   Hypothyroidism   Cellulitis of left lower extremity    Time spent: 30 min    Brynleigh Sequeira, [facility]  [facility] Pager [phone]. If 7PM-7AM, please contact night-coverage at [email or web address], password [facility] 04/08/2015, 8:25 AM  LOS: 1 day

## 2015-04-08 NOTE — Plan of Care (Addendum)
Problem: Food- and Nutrition-Related Knowledge Deficit (NB-1.1) Goal: Nutrition education Formal process to instruct or train a patient/client in a skill or to impart knowledge to help patients/clients voluntarily manage or modify food choices and eating behavior to maintain or improve health. Outcome: Adequate for Discharge  RD consulted for nutrition education regarding diabetes.     Lab Results  Component Value Date    HGBA1C 8.2* 04/06/2015   Spoke with pt at bedside, who admits to noncompliance with self-management. He is followed by an endocrinologist (Dr. Chalmers Cater). Pt estimates he was first diagnosed with diabetes 10-15 years ago and has been on insulin for the vast majority of his diagnosis. He reveals his Hgb A1c has ranged from 7-10 and has never had an A1c below 7. He admits that he did well with 70/30 insulin, but was recently switched to a basal/bolus regimen, but stopped taking dosages due to perceived side effects.   Pt reports he has started to become more aware of his habits, including waiting to treat hypoglycemic episodes and eating large portions of carbohydrates. Meal intake consists of Breakfast: skip (occasional sausage biscuit), Lunch: 5-6 sandwiches and sweets, and Dinner: meat, starch, and vegetable. Beverages are mainly water, diet sodas, and unsweetened tea.   He has not had formal diabetes education, however, expresses interest in following-up with [facility] while he is on medical leave through the end of the year.  RD provided "Carbohydrate Counting for People with Diabetes" handout from the Academy of Nutrition and Dietetics. Discussed different food groups and their effects on blood sugar, emphasizing carbohydrate-containing foods. Provided list of carbohydrates and recommended serving sizes of common foods.  Discussed importance of controlled and consistent carbohydrate intake throughout the day. Provided examples of ways to balance meals/snacks and encouraged intake of  high-fiber, whole grain complex carbohydrates. Teach back method used.  The majority of this visit was spend counseling pt on lifestyle changes to help him better manage his blood sugars. He is in the contemplation stage of change; findings discussed with RN.  Expect fair compliance.  Body mass index is 34.67 kg/(m^2). Pt meets criteria for obesity, class I based on current BMI.  Current diet order is Heart Healthy/ Carb Modified, patient is consuming approximately 75-100% of meals at this time. Labs and medications reviewed. No further nutrition interventions warranted at this time. RD contact information provided. If additional nutrition issues arise, please re-consult RD.  Tyrian Peart A. Jimmye Norman, RD, LDN, CDE Pager: [phone] After hours Pager: [phone]

## 2015-04-08 NOTE — Progress Notes (Signed)
Pt CBG 464 physician on call was paged ordered to give levemir 20u that he refused and 4u  novolog once, order carried out and pt verbalised that he is feeling better, i will continue to monitor CBG

## 2015-04-08 NOTE — Progress Notes (Signed)
Pt. Was able to administer lunch and dinner time insulin to himself with this nurse at bedside. Encourage pt. To direct shot straight into injection site and to rotate. Will continue to educate and monitor.

## 2015-04-09 LAB — GLUCOSE, CAPILLARY
Glucose-Capillary: 223 mg/dL — ABNORMAL HIGH (ref 65–99)
Glucose-Capillary: 166 mg/dL — ABNORMAL HIGH (ref 65–99)
Glucose-Capillary: 36 mg/dL — CL (ref 65–99)
Glucose-Capillary: 76 mg/dL (ref 65–99)
Glucose-Capillary: 387 mg/dL — ABNORMAL HIGH (ref 65–99)

## 2015-04-09 LAB — CBC
HCT: 38.7 % — ABNORMAL LOW (ref 39.0–52.0)
Hemoglobin: 12.3 g/dL — ABNORMAL LOW (ref 13.0–17.0)
MCH: 30.2 pg (ref 26.0–34.0)
MCHC: 31.8 g/dL (ref 30.0–36.0)
MCV: 95.1 fL (ref 78.0–100.0)
Platelets: 422 10*3/uL — ABNORMAL HIGH (ref 150–400)
RBC: 4.07 MIL/uL — ABNORMAL LOW (ref 4.22–5.81)
RDW: 14.5 % (ref 11.5–15.5)
WBC: 6 10*3/uL (ref 4.0–10.5)

## 2015-04-09 LAB — BASIC METABOLIC PANEL
Anion gap: 10 (ref 5–15)
BUN: 10 mg/dL (ref 6–20)
CO2: 31 mmol/L (ref 22–32)
Calcium: 9.2 mg/dL (ref 8.9–10.3)
Chloride: 99 mmol/L — ABNORMAL LOW (ref 101–111)
Creatinine, Ser: 1 mg/dL (ref 0.61–1.24)
GFR calc Af Amer: >60 mL/min (ref >60)
GFR calc non Af Amer: >60 mL/min (ref >60)
Glucose, Bld: 157 mg/dL — ABNORMAL HIGH (ref 65–99)
Potassium: 3.8 mmol/L (ref 3.5–5.1)
Sodium: 140 mmol/L (ref 135–145)

## 2015-04-09 LAB — VANCOMYCIN, TROUGH: Vancomycin Tr: 31 ug/mL (ref 10.0–20.0)

## 2015-04-09 MED ORDER — DEXTROSE 50 % IV SOLN
INTRAVENOUS | Status: AC
  Filled 2015-04-09: qty 50

## 2015-04-09 NOTE — Progress Notes (Signed)
Notified Md about vanc throu of 31

## 2015-04-09 NOTE — Progress Notes (Signed)
Inpatient Diabetes Program Recommendations  AACE/ADA: New Consensus Statement on Inpatient Glycemic Control (2015)  Target Ranges:  Prepandial:   less than 140 mg/dL      Peak postprandial:   less than 180 mg/dL (1-2 hours)      Critically ill patients:  140 - 180 mg/dL   Review of Glycemic Control  Results for Eduardo Long, Eduardo Long (MRN [id number]) as of 04/09/2015 09:51  Ref. Range 04/08/2015 07:45 04/08/2015 11:25 04/08/2015 16:18 04/08/2015 21:35 04/09/2015 07:53  Glucose-Capillary Latest Ref Range: 65-99 mg/dL 320 (H) 217 (H) 101 (H) 180 (H) 223 (H)   Agree with Levemir 20 units bid and Novolog 10 units tidwc. May need to titrate up meal coverage insulin after discharge. Stressed importance of f/u with PCP with blood sugar log. RN allowing pt to give own insulin while inpatient.   Thank you. Lorenda Peck, RD, LDN, CDE Inpatient Diabetes Coordinator [phone]

## 2015-04-09 NOTE — Progress Notes (Addendum)
Call Back number [employer]: [phone] for UR.  04-10-2015 08:25 Spoke with Linford Arnold to request P2P Transferred to [phone] to leave VM requesting P2P. Thayer Headings states they check VM hourly.

## 2015-04-09 NOTE — Progress Notes (Signed)
[facility] PROGRESS NOTE  Eduardo Long YQM:[id number] DOB: 05-13-1956 DOA: 04/06/2015 PCP: Orpah Melter, MD  Brief Summary  The patient is a 59 y.o. year-old male with history of diabetes mellitus type 2, asthma, hypothyroidism who presents with left foot and leg swelling and pain. About 9 days prior to admission, he noticed a blood blister on the fourth toe of the left foot which he attributed to an ill-fitting pair of shoes. He was seen the next day by a physician at which time his toe and forefoot had swelled some. He was sent to the hospital for evaluation where he had pus squeezed out of his toe. He was given a dose of antibiotics in the ER via IV and sent home with a prescription for clindamycin which he took for 4-5 days. Although his foot hurt when the pus was drained, he states he has not had much pain, however, his foot and leg became increasingly red and swollen. He was seen about 4 days prior to admission by his PCP who changed his antibiotics from clinda to bactrim + augmentin which he has been taking for the last few days. He was seen again by his PCP two days later and told that his foot looked worse and that he should come to the ER. He has not had fevers, chills, nausea, vomiting, or other systemic symptoms. He states he has had increasing wheezing, dyspnea, lower extremity edema over the last month. He has gained 30-lbs in teh last month which he attributes to his prednisone. He denies chest pressure, however, his wife states that he complained of chest tightness to her previously. He denies PND and orthopnea.   Ihe ER, VSS, WBC 6.6, lactic acid 2. His creatinine is 1.25 with previous baseline of 1 and he is mildly anemic. He had bilateral lower extremity swelling worse on the left foot with evidence of cellulitis of the foot, toes, and lower leg. He was started on vancomycin and zosyn and admitted for cellulitis which failed outpatient therapy.    Assessment/Plan  Left lower extremity cellulitis that failed outpatient management without evidence of sepsis, present at time of admission.  Finally starting to show signs of improvement today.   - A1c 8.2 - HIV PCR NR - BCx NGTD - Continue vancomycin and zosyn day 4  Bilateral lower extremity edema concerning for acute diastolic heart failure, swelling improving -  -2L yesterday -  Wt down 13-lbs since admission (6kg) - ECHO demonstrated mild LVH with EF 65-70% - UA neg - TSH 1.039 wnl - TED hose - Elevate legs - continue lasix 40mg  IV BID - Duplex lower extremities neg for DVT  Dyspnea, CXR without evidence of effusion or vascular congestion, however, this is improving quickly with diuresis. - ECHO as above - continue dulera and duonebs - Outpatient PFTs -  Outpatient sleep study   Weight gain, likely secondary to steroids recently -  Try to control breathing problems without pulse steroids -  Encouraged diet and exercise for weight loss -  Nutrition consultation appreciated  Diabetes mellitus type 2, uncontrolled. Patient states that he stopped taking his lantus 50  Units BID several months ago secondary to shortness of breath.  He has also been making changes to his aspart insulin without the advice of his endocrinologist.  He snacks all day.  Had tried to adjust his insulin closer to his home regimen initially but he became hypoglycemic.   - A1c 8.2 - continue levemir 20 units daily - Continue aspart  10 units with meals -  Continue moderate dose SSI and HS insulin -  Greatly appreciate Diabetic educator assistance  Hyperlipidemia, stable, continue atorvastatin  Hypothyroidism - TSH wnl - Continue synthroid  Normocytic anemia - Iron studies, B12, folate all wnl - TSH wnl - hgb stable  Hyponatremia due to hyperglycemia > corrects to normal  Diet: diabetic Access: PIV IVF: Off  Proph: lovenox  Code Status: full Family  Communication: patient alone Disposition Plan:  Continue IV antibiotics and diuresis, reassess in AM   Consultants:  none  Procedures:  ECHO  Antibiotics:  vanco 11/5 >   Zosyn 11/5 >   HPI/Subjective:  Swelling in legs, abdomen, and flanks improving.  Face is less swollen and able to walk farther before dyspneic.  Left forefoot and toes are finally becoming less swollen and red.    Objective: Filed Vitals:   04/08/15 2036 04/08/15 2137 04/09/15 0514 04/09/15 0602  BP:  128/63 126/80   Pulse:  98 83   Temp:  98.4 F (36.9 C) 97.5 F (36.4 C)   TempSrc:  Oral Oral   Resp:  18 18   Height:      Weight:    107.3 kg (236 lb 8.9 oz)  SpO2: 96% 95% 96%     Intake/Output Summary (Last 24 hours) at 04/09/15 1125 Last data filed at 04/09/15 1053  Gross per 24 hour  Intake   1480 ml  Output   3175 ml  Net  -1695 ml   Filed Weights   04/07/15 0602 04/08/15 0437 04/09/15 0602  Weight: 113.3 kg (249 lb 12.5 oz) 109.6 kg (241 lb 10 oz) 107.3 kg (236 lb 8.9 oz)   Body mass index is 33.94 kg/(m^2).  Exam:   General:  Obese male, no acute distress.  He has mildly pressured speech and tends to repeat himself.  Has difficulty answering questions directly.    HEENT:  NCAT, MMM  Cardiovascular:  RRR, nl S1, S2 no mrg, 2+ pulses, warm extremities  Respiratory:  Diminished bilateral breath sounds, no focal rales, rhonchi, or wheeze, no increased WOB  Abdomen:   NABS, soft, NT/ND, skin feels less indurated or tense on flanks and abdomen  MSK:   Normal tone and bulk, 2+ pitting edema of the left leg and 1+ of the right leg with evidence of stasis dermatitis.  Left forefoot with much less pink erythema just over the MTP joints today. Fourth toe still has a dry yellow-crusted ulcer on the dorsum of the toe.    Neuro:  Grossly intact  Data Reviewed: Basic Metabolic Panel:  Recent Labs Lab 04/06/15 1608 04/07/15 0241 04/08/15 0533 04/09/15 0423  NA 139 139 133* 140  K  4.1 4.5 4.2 3.8  CL 102 104 94* 99*  CO2 30 28 27 31   GLUCOSE 114* 160* 441* 157*  BUN 16 13 14 10   CREATININE 1.25* 1.04 1.22 1.00  CALCIUM 8.8* 8.5* 8.9 9.2   Liver Function Tests:  Recent Labs Lab 04/06/15 1608  AST 48*  ALT 60  ALKPHOS 85  BILITOT 0.4  PROT 6.2*  ALBUMIN 3.4*   No results for input(s): LIPASE, AMYLASE in the last 168 hours. No results for input(s): AMMONIA in the last 168 hours. CBC:  Recent Labs Lab 04/06/15 1608 04/07/15 0241 04/08/15 0533 04/09/15 0423  WBC 6.6 6.1 6.7 6.0  NEUTROABS 3.6  --   --   --   HGB 12.6* 11.3* 12.2* 12.3*  HCT 38.4* 35.6* 37.9*  38.7*  MCV 93.9 96.5 95.2 95.1  PLT 380 349 395 422*    Recent Results (from the past 240 hour(s))  Culture, blood (routine x 2)     Status: None (Preliminary result)   Collection Time: 04/06/15  3:38 PM  Result Value Ref Range Status   Specimen Description BLOOD LEFT ARM  Final   Special Requests   Final    BOTTLES DRAWN AEROBIC AND ANAEROBIC BLUE 10CC RED 5CC   Culture NO GROWTH 2 DAYS  Final   Report Status PENDING  Incomplete  Urine culture     Status: None   Collection Time: 04/06/15  4:00 PM  Result Value Ref Range Status   Specimen Description URINE, CLEAN CATCH  Final   Special Requests NONE  Final   Culture NO GROWTH 1 DAY  Final   Report Status 04/07/2015 FINAL  Final  Culture, blood (routine x 2)     Status: None (Preliminary result)   Collection Time: 04/06/15  7:32 PM  Result Value Ref Range Status   Specimen Description BLOOD LEFT HAND  Final   Special Requests BOTTLES DRAWN AEROBIC AND ANAEROBIC 5CC  Final   Culture NO GROWTH 2 DAYS  Final   Report Status PENDING  Incomplete     Studies: No results found.  Scheduled Meds: . atorvastatin  40 mg Oral Daily  . enoxaparin (LOVENOX) injection  0.5 mg/kg Subcutaneous Q24H  . furosemide  40 mg Intravenous BID  . insulin aspart  0-15 Units Subcutaneous TID WC  . insulin aspart  0-5 Units Subcutaneous QHS  . insulin  aspart  10 Units Subcutaneous TID WC  . insulin detemir  20 Units Subcutaneous BID  . ipratropium-albuterol  3 mL Nebulization BID  . levothyroxine  200 mcg Oral QAC breakfast  . mometasone-formoterol  2 puff Inhalation BID  . piperacillin-tazobactam (ZOSYN)  IV  3.375 g Intravenous Q8H  . vancomycin  750 mg Intravenous Q12H   Continuous Infusions:   Active Problems:   IDDM (insulin dependent diabetes mellitus) ([place])   Cellulitis of leg, left   Bilateral lower extremity edema   Dyspnea   Hypothyroidism   Cellulitis of left lower extremity    Time spent: 30 min    Naliya Gish, [facility]  [facility] Pager [phone]. If 7PM-7AM, please contact night-coverage at [email or web address], password [facility] 04/09/2015, 11:25 AM  LOS: 2 days

## 2015-04-09 NOTE — Progress Notes (Signed)
PHARMACY NOTE  Eduardo Long is a 59 y.o. year-old male admitted on 04/06/2015.  The patient is currently on Vancomycin for Cellulitis.  LABS: Vancomycin Trough  VANCOMYCIN TR  Date Value Ref Range Status  04/09/2015 31* 10.0 - 20.0 ug/mL Final    Comment:    CRITICAL RESULT CALLED TO, READ BACK BY AND VERIFIED WITH: Marquis Lunch RN [phone] 1959 GREEN R   NOTE:  THE TROUGH LEVEL DRAWN IMMEDIATELY AFTER A VANCOMYCIN DOSE GIVEN 1 HOUR EARLY.  RESULTS ARE INVALID.  PLAN:  1. Will continue Vancomycin as previously ordered, Vancomycin 750 mg IV q 12 hours. 2. Will redraw Vancomycin trough prior to next scheduled Vancomycin dose. 3. I have ordered that the Vancomycin dose be held until AFTER the Vancomycin trough collection.  Thank you for allowing pharmacy to be a part of this patient's care.  Estelle June PharmD 04/09/2015 8:20 PM

## 2015-04-10 DIAGNOSIS — L03116 Cellulitis of left lower limb: Principal | ICD-10-CM

## 2015-04-10 DIAGNOSIS — E119 Type 2 diabetes mellitus without complications: Secondary | ICD-10-CM

## 2015-04-10 DIAGNOSIS — I5031 Acute diastolic (congestive) heart failure: Secondary | ICD-10-CM

## 2015-04-10 DIAGNOSIS — Z794 Long term (current) use of insulin: Secondary | ICD-10-CM

## 2015-04-10 DIAGNOSIS — R600 Localized edema: Secondary | ICD-10-CM

## 2015-04-10 LAB — VANCOMYCIN, TROUGH: Vancomycin Tr: 7 ug/mL — ABNORMAL LOW (ref 10.0–20.0)

## 2015-04-10 LAB — CBC
HCT: 39.5 % (ref 39.0–52.0)
Hemoglobin: 13 g/dL (ref 13.0–17.0)
MCH: 31.1 pg (ref 26.0–34.0)
MCHC: 32.9 g/dL (ref 30.0–36.0)
MCV: 94.5 fL (ref 78.0–100.0)
Platelets: 474 10*3/uL — ABNORMAL HIGH (ref 150–400)
RBC: 4.18 MIL/uL — ABNORMAL LOW (ref 4.22–5.81)
RDW: 14.3 % (ref 11.5–15.5)
WBC: 6.3 10*3/uL (ref 4.0–10.5)

## 2015-04-10 LAB — BASIC METABOLIC PANEL
Anion gap: 8 (ref 5–15)
BUN: 12 mg/dL (ref 6–20)
CO2: 30 mmol/L (ref 22–32)
Calcium: 9.3 mg/dL (ref 8.9–10.3)
Chloride: 101 mmol/L (ref 101–111)
Creatinine, Ser: 0.97 mg/dL (ref 0.61–1.24)
GFR calc Af Amer: >60 mL/min (ref >60)
GFR calc non Af Amer: >60 mL/min (ref >60)
Glucose, Bld: 112 mg/dL — ABNORMAL HIGH (ref 65–99)
Potassium: 3.7 mmol/L (ref 3.5–5.1)
Sodium: 139 mmol/L (ref 135–145)

## 2015-04-10 LAB — GLUCOSE, CAPILLARY
Glucose-Capillary: 191 mg/dL — ABNORMAL HIGH (ref 65–99)
Glucose-Capillary: 133 mg/dL — ABNORMAL HIGH (ref 65–99)
Glucose-Capillary: 78 mg/dL (ref 65–99)
Glucose-Capillary: 272 mg/dL — ABNORMAL HIGH (ref 65–99)

## 2015-04-10 MED ORDER — VANCOMYCIN HCL 10 G IV SOLR
1250.0000 mg | Freq: Two times a day (BID) | INTRAVENOUS | Status: DC
  Administered 2015-04-10 – 2015-04-11 (×3): 1250 mg via INTRAVENOUS
  Filled 2015-04-10 (×5): qty 1250

## 2015-04-10 MED ORDER — INSULIN ASPART 100 UNIT/ML ~~LOC~~ SOLN
3.0000 [IU] | Freq: Three times a day (TID) | SUBCUTANEOUS | Status: DC
  Administered 2015-04-10 – 2015-04-11 (×3): 3 [IU] via SUBCUTANEOUS

## 2015-04-10 MED ORDER — FUROSEMIDE 10 MG/ML IJ SOLN
40.0000 mg | Freq: Three times a day (TID) | INTRAMUSCULAR | Status: DC
  Administered 2015-04-10 – 2015-04-12 (×6): 40 mg via INTRAVENOUS
  Filled 2015-04-10 (×6): qty 4

## 2015-04-10 NOTE — Progress Notes (Signed)
ANTIBIOTIC CONSULT NOTE - FOLLOW UP  Pharmacy Consult for Vancomycin  Indication: Cellulitis  No Known Allergies  Patient Measurements: Height: 5\' 10"  (177.8 cm) Weight: 233 lb 7.5 oz (105.9 kg) IBW/kg (Calculated) : 73 Vital Signs: Temp: 98.1 F (36.7 C) (11/09 0524) Temp Source: Oral (11/09 0524) BP: 115/76 mmHg (11/09 0524) Pulse Rate: 86 (11/09 0524)  Labs:  Recent Labs  04/08/15 0533 04/09/15 0423 04/10/15 0600  WBC 6.7 6.0 6.3  HGB 12.2* 12.3* 13.0  PLT 395 422* 474*  CREATININE 1.22 1.00 0.97   Recent Labs  04/09/15 1816 04/10/15 0600  VANCOTROUGH 31* 7*     Assessment: Sub-therapeutic vancomycin trough, drawn correctly. Previous high level was drawn while vancomycin was infusing.   Goal of Therapy:  Vancomycin trough level 10-15 mcg/ml  Plan:  -Increase vancomycin to 1250 mg IV q12h -Re-check vancomycin trough at steady state  Narda Bonds 04/10/2015,7:27 AM

## 2015-04-10 NOTE — Progress Notes (Addendum)
Hypoglycemic Event  CBG: 45 (per pt. home meter)  Treatment: Gave pt. 2 orange juices and a pack of nabs  Symptoms: sensitivity to light (per pt.)  Follow-up CBG: Time: 1633 CBG Result:78  Possible Reasons for Event: Either newly added Levemir 20U BID or meal coverage dosage.  Comments/MD notified: Informed DM coordinator and Dr. Carles Collet; encouraged Levemir to be decreased; orders for meal coverage changed by MD    Eduardo Long

## 2015-04-10 NOTE — Progress Notes (Signed)
72 hour clinical update sent through [employer], and daily progress notes and H&P sent through electronic fax through [place] to [phone] and [phone].

## 2015-04-10 NOTE — Progress Notes (Signed)
PROGRESS NOTE  Eduardo Long FVC:[id number] DOB: 13-Mar-1956 DOA: 04/06/2015 PCP: Orpah Melter, MD   Brief Summary  The patient is a 59 y.o. year-old male with history of diabetes mellitus type 2, asthma, hypothyroidism who presents with left foot and leg swelling and pain. About 9 days prior to admission, he noticed a blood blister on the fourth toe of the left foot which he attributed to an ill-fitting pair of shoes. He was seen the next day by a physician at which time his toe and forefoot had swelled some. He was sent to the hospital for evaluation where he had pus squeezed out of his toe. He was given a dose of antibiotics in the ER via IV and sent home with a prescription for clindamycin which he took for 4-5 days. Although his foot hurt when the pus was drained, he states he has not had much pain, however, his foot and leg became increasingly red and swollen. He was seen about 4 days prior to admission by his PCP who changed his antibiotics from clinda to bactrim + augmentin which he has been taking for the last few days. He was seen again by his PCP two days later and told that his foot looked worse and that he should come to the ER. He has not had fevers, chills, nausea, vomiting, or other systemic symptoms. He states he has had increasing wheezing, dyspnea, lower extremity edema over the last month. He has gained 30-lbs in teh last month which he attributes to his prednisone. He denies chest pressure, however, his wife states that he complained of chest tightness to her previously. He denies PND and orthopnea.   Ihe ER, VSS, WBC 6.6, lactic acid 2. His creatinine is 1.25 with previous baseline of 1 and he is mildly anemic. He had bilateral lower extremity swelling worse on the left foot with evidence of cellulitis of the foot, toes, and lower leg. He was started on vancomycin and zosyn and admitted for cellulitis which failed outpatient therapy.    Assessment/Plan  Left lower extremity cellulitis that failed outpatient management without evidence of sepsis, present at time of admission. Finally starting to show signs of improvement today.  - A1c 8.2 - HIV ab NR - BCx NGTD - Continue vancomycin day 5 - d/c zosyn  Bilateral lower extremity edema/acute diastolic heart failure,  -swelling improving with intravenous furosemide - suspect undiagnosed OSA - NEG 5.3 L for the admission - NEG 8 pounds in past 48 hour - ECHO demonstrated mild LVH with EF 65-70% - UA neg - TSH 1.039 wnl - TED hose - Elevate legs - increase furosemide to 40 mg IV every 8 hours - Duplex lower extremities neg for DVT  Dyspnea, CXR without evidence of effusion or vascular congestion, however, this is improving quickly with diuresis. - ECHO as above - continue dulera and duonebs - Outpatient PFTs - Outpatient sleep study   Weight gain,  - likely secondary to steroids recently as well as CHF - Try to control breathing problems without pulse steroids - Encouraged diet and exercise for weight loss - Nutrition consultation appreciated  Diabetes mellitus type 2, uncontrolled. Patient states that he stopped taking his lantus 50 Units BID several months ago secondary to shortness of breath. He has also been making changes to his aspart insulin without the advice of his endocrinologist. He snacks all day. Had tried to adjust his insulin closer to his home regimen initially but  he became hypoglycemic.  - A1c 8.2 - continue levemir 20 units daily - Continue aspart 10 units with meals - Continue moderate dose SSI and HS insulin - Greatly appreciate Diabetic educator assistance  Hyperlipidemia, stable, continue atorvastatin  Hypothyroidism - TSH wnl - Continue synthroid  Normocytic anemia - Iron studies, B12, folate all wnl - TSH wnl - hgb stable  Hyponatremia due to hyperglycemia > corrects to  normal  Diet: diabetic  Code Status: full Family Communication: patient alone Disposition Plan: Continue IV antibiotics and diuresis, reassess in AM   Consultants:  none  Procedures:  ECHO  Antibiotics:  vanco 11/5 >   Zosyn 11/5 >     Procedures/Studies: Dg Chest 2 View  04/06/2015  CLINICAL DATA:  59 year old male with a history of swelling EXAM: CHEST - 2 VIEW COMPARISON:  04/29/2005 FINDINGS: Cardiomediastinal silhouette projects within normal limits in size and contour. No confluent airspace disease, pneumothorax, or pleural effusion. No displaced fracture. Unremarkable appearance of the upper abdomen. IMPRESSION: No radiographic evidence of acute cardiopulmonary disease. Signed, Dulcy Fanny. Earleen Newport, DO [facility] Electronically Signed   By: Corrie Mckusick D.O.   On: 04/06/2015 16:07   Ct Head W Wo Contrast  03/13/2015  CLINICAL DATA:  Scalp lipomas. EXAM: CT HEAD WITHOUT AND WITH CONTRAST TECHNIQUE: Contiguous axial images were obtained from the base of the skull through the vertex without and with intravenous contrast CONTRAST:  58mL ISOVUE-300 IOPAMIDOL (ISOVUE-300) INJECTION 61% COMPARISON:  None. FINDINGS: A lipoma in the left posterior occipital scalp measures 5.8 x 1.6 x 5.5 cm. There are several septations within this lipoma. No enhancing soft tissue is present. A 1.4 cm right frontal scalp lipoma is also present. No other focal lipomatous lesions are present. Scalp is otherwise within normal limits. A fluid level is present in the left maxillary sinus. There is scattered fluid throughout the ethmoid air cells bilaterally. There is some mucosal thickening into the inferior frontal sinuses bilaterally. The mastoid air cells are clear. The calvarium is intact. Mild physiologic calcifications are present within the dentate nuclei bilaterally. No acute infarct, hemorrhage, or mass lesion is present. The ventricles are  of normal size. The basal ganglia are intact. The cortex is unremarkable. No significant extra-axial fluid collection is present. The postcontrast images demonstrate no pathologic enhancement. IMPRESSION: 1. 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 There no enhancing lesions to suggest malignancy. Recommend continued clinical all observation for any growth of this leak and. 2. More simple appearing lipoma in the anterior right frontal scalp. 3. Normal CT appearance of the brain for age. Electronically Signed   By: San Morelle M.D.   On: 03/13/2015 14:03        Subjective: Patient denies fevers, chills, headache, chest pain, dyspnea, nausea, vomiting, diarrhea, abdominal pain, dysuria, hematuria   Objective: Filed Vitals:   04/10/15 0524 04/10/15 0821 04/10/15 0824 04/10/15 1401  BP: 115/76  120/75 122/81  Pulse: 86 88 96 109  Temp: 98.1 F (36.7 C)  98.4 F (36.9 C) 98.8 F (37.1 C)  TempSrc: Oral  Oral Oral  Resp: 20 20 18 20   Height:      Weight:      SpO2: 94% 94% 93% 98%    Intake/Output Summary (Last 24 hours) at 04/10/15 1436 Last data filed at 04/10/15 1300  Gross per 24 hour  Intake    598 ml  Output   2025 ml  Net  -1427 ml   Weight change: -1.4 kg (-3 lb 1.4 oz) Exam:   General:  Pt is alert, follows commands appropriately, not in acute distress  HEENT: No icterus, No thrush, No neck mass, [place]/AT  Cardiovascular: RRR, S1/S2, no rubs, no gallops  Respiratory: CTA bilaterally, no wheezing, no crackles, no rhonchi  Abdomen: Soft/+BS, non tender, non distended, no guarding  Extremities: 2 + LE edema, No lymphangitis, No petechiae, No rashes, no synovitis  Data Reviewed: Basic Metabolic Panel:  Recent Labs Lab 04/06/15 1608 04/07/15 0241 04/08/15 0533 04/09/15 0423 04/10/15 0600  NA 139 139 133* 140 139  K 4.1 4.5 4.2 3.8 3.7  CL 102 104 94* 99* 101  CO2 30 28 27 31 30   GLUCOSE 114* 160* 441* 157* 112*  BUN 16 13  14 10 12   CREATININE 1.25* 1.04 1.22 1.00 0.97  CALCIUM 8.8* 8.5* 8.9 9.2 9.3   Liver Function Tests:  Recent Labs Lab 04/06/15 1608  AST 48*  ALT 60  ALKPHOS 85  BILITOT 0.4  PROT 6.2*  ALBUMIN 3.4*   No results for input(s): LIPASE, AMYLASE in the last 168 hours. No results for input(s): AMMONIA in the last 168 hours. CBC:  Recent Labs Lab 04/06/15 1608 04/07/15 0241 04/08/15 0533 04/09/15 0423 04/10/15 0600  WBC 6.6 6.1 6.7 6.0 6.3  NEUTROABS 3.6  --   --   --   --   HGB 12.6* 11.3* 12.2* 12.3* 13.0  HCT 38.4* 35.6* 37.9* 38.7* 39.5  MCV 93.9 96.5 95.2 95.1 94.5  PLT 380 349 395 422* 474*   Cardiac Enzymes: No results for input(s): CKTOTAL, CKMB, CKMBINDEX, TROPONINI in the last 168 hours. BNP: Invalid input(s): POCBNP CBG:  Recent Labs Lab 04/09/15 1650 04/09/15 1712 04/09/15 2113 04/10/15 0808 04/10/15 1209  GLUCAP 36* 76 387* 191* 133*    Recent Results (from the past 240 hour(s))  Culture, blood (routine x 2)     Status: None (Preliminary result)   Collection Time: 04/06/15  3:38 PM  Result Value Ref Range Status   Specimen Description BLOOD LEFT ARM  Final   Special Requests   Final    BOTTLES DRAWN AEROBIC AND ANAEROBIC BLUE 10CC RED 5CC   Culture NO GROWTH 4 DAYS  Final   Report Status PENDING  Incomplete  Urine culture     Status: None   Collection Time: 04/06/15  4:00 PM  Result Value Ref Range Status   Specimen Description URINE, CLEAN CATCH  Final   Special Requests NONE  Final   Culture NO GROWTH 1 DAY  Final   Report Status 04/07/2015 FINAL  Final  Culture, blood (routine x 2)     Status: None (Preliminary result)   Collection Time: 04/06/15  7:32 PM  Result Value Ref Range Status   Specimen Description BLOOD LEFT HAND  Final   Special Requests BOTTLES DRAWN AEROBIC AND ANAEROBIC 5CC  Final   Culture NO GROWTH 4 DAYS  Final   Report Status PENDING  Incomplete     Scheduled Meds: . atorvastatin  40 mg Oral Daily  . enoxaparin  (LOVENOX) injection  0.5 mg/kg Subcutaneous Q24H  . furosemide  40 mg Intravenous TID  . insulin aspart  0-15 Units Subcutaneous TID WC  . insulin aspart  0-5 Units Subcutaneous QHS  . insulin aspart  10 Units Subcutaneous TID WC  . insulin detemir  20 Units Subcutaneous BID  . ipratropium-albuterol  3 mL Nebulization BID  .  levothyroxine  200 mcg Oral QAC breakfast  . mometasone-formoterol  2 puff Inhalation BID  . piperacillin-tazobactam (ZOSYN)  IV  3.375 g Intravenous Q8H  . vancomycin  1,250 mg Intravenous Q12H   Continuous Infusions:    Arham Symmonds, DO  [facility] Pager [phone]  If 7PM-7AM, please contact night-coverage [email or web address] Password [email or web address] 04/10/2015, 2:36 PM   LOS: 3 days

## 2015-04-10 NOTE — Evaluation (Signed)
Physical Therapy Evaluation Patient Details Name: Tishawn Friedhoff MRN: [id number] DOB: 28-Sep-1955 Today's Date: 04/10/2015   History of Present Illness  Patient is a 59 y/o male with hx of DM, asthma and hypothyroidism who presents with left foot/leg swelling and pain.Pt has BLE swelling worse on the left foot with evidence of cellulitis of the foot, toes, and lower leg, was started on vancomycin and zosyn and admitted for cellulitis which failed outpatient therapy.  Clinical Impression  Patient presents close to functional baseline and able to ambulate community distances and negotiate steps without difficulty. Dyspnea present. Pt understands signs/symptoms of hypo/hyperglycemia. Recommend ambulating a few times per day with supervision of RN due to decrease in glucose with activity. Pt has support from wife at home. All education performed. Pt does not require further skilled therapy services. Discharge from therapy.    Follow Up Recommendations No PT follow up    Equipment Recommendations  None recommended by PT    Recommendations for Other Services       Precautions / Restrictions Precautions Precautions: Fall Precaution Comments: due to hypoglycemia. Restrictions Weight Bearing Restrictions: No      Mobility  Bed Mobility Overal bed mobility: Modified Independent                Transfers Overall transfer level: Independent Equipment used: None             General transfer comment: Stood from EOB x1.   Ambulation/Gait Ambulation/Gait assistance: Modified independent (Device/Increase time) Ambulation Distance (Feet): 500 Feet Assistive device: None Gait Pattern/deviations: WFL(Within Functional Limits)   Gait velocity interpretation: at or above normal speed for age/gender General Gait Details: Pt with steady gait. Dyspnea present 2/4. 2 short standing rest breaks. Reports feeling like blood sugar is low.  Stairs Stairs: Yes Stairs assistance:  Supervision Stair Management: One rail Right;Alternating pattern Number of Stairs: 4 General stair comments: Limited due to lines.   Wheelchair Mobility    Modified Rankin (Stroke Patients Only)       Balance Overall balance assessment: No apparent balance deficits (not formally assessed)                                           Pertinent Vitals/Pain Pain Assessment: No/denies pain    Home Living Family/patient expects to be discharged to:: Private residence Living Arrangements: Spouse/significant other Available Help at Discharge: Family;Available 24 hours/day Type of Home: House Home Access: Stairs to enter Entrance Stairs-Rails: Right Entrance Stairs-Number of Steps: a couple Home Layout: One level Home Equipment: Environmental consultant - 2 wheels;Cane - single point;Bedside commode      Prior Function Level of Independence: Independent         Comments: Stopped working ~1 month ago due to health.     Hand Dominance        Extremity/Trunk Assessment   Upper Extremity Assessment: Defer to OT evaluation           Lower Extremity Assessment: Overall WFL for tasks assessed         Communication   Communication: No difficulties  Cognition Arousal/Alertness: Awake/alert Behavior During Therapy: WFL for tasks assessed/performed Overall Cognitive Status: Within Functional Limits for tasks assessed                      General Comments General comments (skin integrity, edema, etc.): Glucose 45 upon return to  room after ambulation. Brought pt orange juice and crackers. RN notified.     Exercises        Assessment/Plan    PT Assessment Patent does not need any further PT services  PT Diagnosis Difficulty walking   PT Problem List    PT Treatment Interventions     PT Goals (Current goals can be found in the Care Plan section) Acute Rehab PT Goals Patient Stated Goal: to lose some weight PT Goal Formulation: All assessment and  education complete, DC therapy Time For Goal Achievement: 04/24/15 Potential to Achieve Goals: Good    Frequency     Barriers to discharge        Co-evaluation               End of Session   Activity Tolerance: Patient tolerated treatment well Patient left: in bed;with call bell/phone within reach Nurse Communication: Mobility status         Time: 0932-3557 PT Time Calculation (min) (ACUTE ONLY): 17 min   Charges:   PT Evaluation $Initial PT Evaluation Tier I: 1 Procedure     PT G Codes:        Trang Bouse A Katisha Shimizu 04/10/2015, 4:09 PM Wray Kearns, [place], DPT [phone]

## 2015-04-10 NOTE — Progress Notes (Signed)
Staff RN paged diabetes coordinator to ask about patient's insulin dose.  Stated that patient had low blood sugar around 1630 and questioned the dosages of insulin and if they needed to be changed. Recommended decreasing Lantus to 15 units BID and /or decreasing Novolog meal coverage to 5-6 units TID ( about half of the amount that patient was getting) especially if patient is not eating here as much as he eats at home. Will continue to monitor blood sugars in the hospital. Harvel Ricks RN BSN CDE

## 2015-04-10 NOTE — Consult Note (Signed)
Eduardo Long M. [name], EdD 

## 2015-04-11 DIAGNOSIS — E1165 Type 2 diabetes mellitus with hyperglycemia: Secondary | ICD-10-CM

## 2015-04-11 DIAGNOSIS — L03116 Cellulitis of left lower limb: Secondary | ICD-10-CM | POA: Insufficient documentation

## 2015-04-11 LAB — BASIC METABOLIC PANEL
Anion gap: 9 (ref 5–15)
BUN: 14 mg/dL (ref 6–20)
CO2: 30 mmol/L (ref 22–32)
Calcium: 9.3 mg/dL (ref 8.9–10.3)
Chloride: 98 mmol/L — ABNORMAL LOW (ref 101–111)
Creatinine, Ser: 1.06 mg/dL (ref 0.61–1.24)
GFR calc Af Amer: >60 mL/min (ref >60)
GFR calc non Af Amer: >60 mL/min (ref >60)
Glucose, Bld: 131 mg/dL — ABNORMAL HIGH (ref 65–99)
Potassium: 3.8 mmol/L (ref 3.5–5.1)
Sodium: 137 mmol/L (ref 135–145)

## 2015-04-11 LAB — CBC
HCT: 40.6 % (ref 39.0–52.0)
Hemoglobin: 13.4 g/dL (ref 13.0–17.0)
MCH: 31.2 pg (ref 26.0–34.0)
MCHC: 33 g/dL (ref 30.0–36.0)
MCV: 94.4 fL (ref 78.0–100.0)
Platelets: 504 10*3/uL — ABNORMAL HIGH (ref 150–400)
RBC: 4.3 MIL/uL (ref 4.22–5.81)
RDW: 14.2 % (ref 11.5–15.5)
WBC: 6.5 10*3/uL (ref 4.0–10.5)

## 2015-04-11 LAB — CULTURE, BLOOD (ROUTINE X 2)
Culture: NO GROWTH
Culture: NO GROWTH

## 2015-04-11 LAB — GLUCOSE, CAPILLARY
Glucose-Capillary: 142 mg/dL — ABNORMAL HIGH (ref 65–99)
Glucose-Capillary: 354 mg/dL — ABNORMAL HIGH (ref 65–99)
Glucose-Capillary: 70 mg/dL (ref 65–99)
Glucose-Capillary: 234 mg/dL — ABNORMAL HIGH (ref 65–99)

## 2015-04-11 LAB — CK: Total CK: 111 U/L (ref 49–397)

## 2015-04-11 MED ORDER — INSULIN ASPART 100 UNIT/ML ~~LOC~~ SOLN: 8.0000 [IU] | Freq: Three times a day (TID) | SUBCUTANEOUS | Status: DC

## 2015-04-11 MED ORDER — INSULIN ASPART 100 UNIT/ML ~~LOC~~ SOLN
10.0000 [IU] | Freq: Three times a day (TID) | SUBCUTANEOUS | Status: DC
  Administered 2015-04-12: 10 [IU] via SUBCUTANEOUS

## 2015-04-11 MED ORDER — DOXYCYCLINE HYCLATE 100 MG PO TABS
100.0000 mg | ORAL_TABLET | Freq: Two times a day (BID) | ORAL | Status: DC
  Administered 2015-04-11 – 2015-04-13 (×4): 100 mg via ORAL
  Filled 2015-04-11 (×4): qty 1

## 2015-04-11 NOTE — Progress Notes (Signed)
Physician Advisor has not received callback, for P2P, second request made to [phone].

## 2015-04-11 NOTE — Progress Notes (Signed)
Inpatient Diabetes Program Recommendations  AACE/ADA: New Consensus Statement on Inpatient Glycemic Control (2015)  Target Ranges:  Prepandial:   less than 140 mg/dL      Peak postprandial:   less than 180 mg/dL (1-2 hours)      Critically ill patients:  140 - 180 mg/dL   Review of Glycemic Control  Results for Eduardo Long, Eduardo Long (MRN [id number]) as of 04/11/2015 14:06  Ref. Range 04/10/2015 12:09 04/10/2015 16:33 04/10/2015 21:13 04/11/2015 07:38 04/11/2015 11:38  Glucose-Capillary Latest Ref Range: 65-99 mg/dL 133 (H) 78 272 (H) 142 (H) 354 (H)   Needs increase in meal coverage insulin  Inpatient Diabetes Program Recommendations:     Increase Novolog to 8 units tidwc.   Thank you. Lorenda Peck, RD, LDN, CDE Inpatient Diabetes Coordinator [phone]

## 2015-04-11 NOTE — Progress Notes (Addendum)
PROGRESS NOTE  Eduardo Long [id number] DOB: 1956/04/22 DOA: 04/06/2015 PCP: Orpah Melter, MD  Brief Summary  The patient is a 59 y.o. year-old male with history of diabetes mellitus type 2, asthma, hypothyroidism who presents with left foot and leg swelling and pain. About 9 days prior to admission, he noticed a blood blister on the fourth toe of the left foot which he attributed to an ill-fitting pair of shoes. He was seen the next day by a physician at which time his toe and forefoot had swelled some. He was sent to the hospital for evaluation where he had pus squeezed out of his toe. He was given a dose of antibiotics in the ER via IV and sent home with a prescription for clindamycin which he took for 4-5 days. Although his foot hurt when the pus was drained, he states he has not had much pain, however, his foot and leg became increasingly red and swollen. He was seen about 4 days prior to admission by his PCP who changed his antibiotics from clinda to bactrim + augmentin which he has been taking for the last few days. He was seen again by his PCP two days later and told that his foot looked worse and that he should come to the ER. He has not had fevers, chills, nausea, vomiting, or other systemic symptoms. He states he has had increasing wheezing, dyspnea, lower extremity edema over the last month. He has gained 30-lbs in teh last month which he attributes to his prednisone. He denies chest pressure, however, his wife states that he complained of chest tightness to her previously. He denies PND and orthopnea.   Ihe ER, VSS, WBC 6.6, lactic acid 2. His creatinine is 1.25 with previous baseline of 1 and he is mildly anemic. He had bilateral lower extremity swelling worse on the left foot with evidence of cellulitis of the foot, toes, and lower leg. He was started on vancomycin and zosyn and admitted for cellulitis which failed outpatient therapy.    Assessment/Plan  Left lower extremity cellulitis that failed outpatient management without evidence of sepsis, present at time of admission. Finally starting to show signs of improvement today.  - A1c 8.2 - HIV ab NR - BCx NGTD - d/c vancomycin and zosyn day 6 - start doxycycline po  Bilateral lower extremity edema/acute diastolic heart failure,  -swelling improving with intravenous furosemide - suspect undiagnosed OSA - NEG 7.2 L for the admission - NEG 11 pounds in past 72 hour - ECHO demonstrated mild LVH with EF 65-70%, no WMA - UA neg protein - TSH 1.039 wnl - TED hose - Elevate legs - continue furosemide to 40 mg IV every 8 hours - Duplex lower extremities neg for DVT  Dyspnea, CXR without evidence of effusion or vascular congestion, however, this is improving quickly with diuresis. - ECHO as above - continue dulera and duonebs - Outpatient PFTs - Outpatient sleep study   Weight gain,  - likely secondary to steroids recently as well as CHF - Try to control breathing problems without pulse steroids - Encouraged diet and exercise for weight loss - Nutrition consultation appreciated  Diabetes mellitus type 2, uncontrolled. Patient states that he stopped taking his lantus 50 Units BID several months ago secondary to shortness of breath. He has also been making changes to his aspart insulin without the advice of his endocrinologist. He snacks all day. Had tried to adjust his insulin closer to  his home regimen initially but he became hypoglycemic.  - A1c 8.2 - continue levemir 20 units daily - restart aspart 10 units with meals - Continue moderate dose SSI and HS insulin - Greatly appreciate Diabetic educator assistance  Hyperlipidemia, stable, continue atorvastatin  Hypothyroidism - TSH wnl - Continue synthroid  Normocytic anemia - Iron studies, B12, folate all wnl - TSH wnl - hgb stable  Hyponatremia due to  hyperglycemia > corrects to normal  Diet: diabetic  Code Status: full Family Communication: wife updated at bedside 11/10 Disposition Plan: Home on 11/11 or 11/12   Consultants:  none  Procedures:  ECHO  Antibiotics:  vanco 11/5 > 11/10  Zosyn 11/5 >11/9   Procedures/Studies: Dg Chest 2 View  04/06/2015  CLINICAL DATA:  59 year old male with a history of swelling EXAM: CHEST - 2 VIEW COMPARISON:  04/29/2005 FINDINGS: Cardiomediastinal silhouette projects within normal limits in size and contour. No confluent airspace disease, pneumothorax, or pleural effusion. No displaced fracture. Unremarkable appearance of the upper abdomen. IMPRESSION: No radiographic evidence of acute cardiopulmonary disease. Signed, Dulcy Fanny. Earleen Newport, DO [facility] Electronically Signed   By: Corrie Mckusick D.O.   On: 04/06/2015 16:07   Ct Head W Wo Contrast  03/13/2015  CLINICAL DATA:  Scalp lipomas. EXAM: CT HEAD WITHOUT AND WITH CONTRAST TECHNIQUE: Contiguous axial images were obtained from the base of the skull through the vertex without and with intravenous contrast CONTRAST:  64mL ISOVUE-300 IOPAMIDOL (ISOVUE-300) INJECTION 61% COMPARISON:  None. FINDINGS: A lipoma in the left posterior occipital scalp measures 5.8 x 1.6 x 5.5 cm. There are several septations within this lipoma. No enhancing soft tissue is present. A 1.4 cm right frontal scalp lipoma is also present. No other focal lipomatous lesions are present. Scalp is otherwise within normal limits. A fluid level is present in the left maxillary sinus. There is scattered fluid throughout the ethmoid air cells bilaterally. There is some mucosal thickening into the inferior frontal sinuses bilaterally. The mastoid air cells are clear. The calvarium is intact. Mild physiologic calcifications are present within the dentate nuclei bilaterally. No acute infarct, hemorrhage, or mass lesion is present.  The ventricles are of normal size. The basal ganglia are intact. The cortex is unremarkable. No significant extra-axial fluid collection is present. The postcontrast images demonstrate no pathologic enhancement. IMPRESSION: 1. 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 There no enhancing lesions to suggest malignancy. Recommend continued clinical all observation for any growth of this leak and. 2. More simple appearing lipoma in the anterior right frontal scalp. 3. Normal CT appearance of the brain for age. Electronically Signed   By: San Morelle M.D.   On: 03/13/2015 14:03         Subjective: Patient continues to be breathing better and states that his leg edema is improving. Denies any fevers, chills, chest discomfort, first breath, nausea, vomiting, diarrhea, abdominal pain. No dysuria or hematuria. No hematochezia or melena.  Objective: Filed Vitals:   04/10/15 2115 04/11/15 0546 04/11/15 0901 04/11/15 1349  BP: 122/85 102/66  119/78  Pulse: 101 85 92 100  Temp: 98.4 F (36.9 C) 98.4 F (36.9 C)  97.9 F (36.6 C)  TempSrc: Oral Oral  Oral  Resp: 18 18 18 18   Height:      Weight:  104.191 kg (229 lb 11.2 oz)    SpO2: 97% 97% 98% 96%    Intake/Output Summary (Last 24 hours)  at 04/11/15 1705 Last data filed at 04/11/15 1350  Gross per 24 hour  Intake    930 ml  Output   2550 ml  Net  -1620 ml   Weight change: -1.709 kg (-3 lb 12.3 oz) Exam:   General:  Pt is alert, follows commands appropriately, not in acute distress  HEENT: No icterus, No thrush, No neck mass, [place]/AT  Cardiovascular: RRR, S1/S2, no rubs, no gallops  Respiratory: CTA bilaterally, no wheezing, no crackles, no rhonchi  Abdomen: Soft/+BS, non tender, non distended, no guarding  Extremities: 1+ LE edema, No lymphangitis, No petechiae, No rashes, no synovitis  Data Reviewed: Basic Metabolic Panel:  Recent Labs Lab 04/07/15 0241 04/08/15 0533 04/09/15 0423  04/10/15 0600 04/11/15 0520  NA 139 133* 140 139 137  K 4.5 4.2 3.8 3.7 3.8  CL 104 94* 99* 101 98*  CO2 28 27 31 30 30   GLUCOSE 160* 441* 157* 112* 131*  BUN 13 14 10 12 14   CREATININE 1.04 1.22 1.00 0.97 1.06  CALCIUM 8.5* 8.9 9.2 9.3 9.3   Liver Function Tests:  Recent Labs Lab 04/06/15 1608  AST 48*  ALT 60  ALKPHOS 85  BILITOT 0.4  PROT 6.2*  ALBUMIN 3.4*   No results for input(s): LIPASE, AMYLASE in the last 168 hours. No results for input(s): AMMONIA in the last 168 hours. CBC:  Recent Labs Lab 04/06/15 1608 04/07/15 0241 04/08/15 0533 04/09/15 0423 04/10/15 0600 04/11/15 0520  WBC 6.6 6.1 6.7 6.0 6.3 6.5  NEUTROABS 3.6  --   --   --   --   --   HGB 12.6* 11.3* 12.2* 12.3* 13.0 13.4  HCT 38.4* 35.6* 37.9* 38.7* 39.5 40.6  MCV 93.9 96.5 95.2 95.1 94.5 94.4  PLT 380 349 395 422* 474* 504*   Cardiac Enzymes:  Recent Labs Lab 04/11/15 0520  CKTOTAL 111   BNP: Invalid input(s): POCBNP CBG:  Recent Labs Lab 04/10/15 1209 04/10/15 1633 04/10/15 2113 04/11/15 0738 04/11/15 1138  GLUCAP 133* 78 272* 142* 354*    Recent Results (from the past 240 hour(s))  Culture, blood (routine x 2)     Status: None   Collection Time: 04/06/15  3:38 PM  Result Value Ref Range Status   Specimen Description BLOOD LEFT ARM  Final   Special Requests   Final    BOTTLES DRAWN AEROBIC AND ANAEROBIC BLUE 10CC RED 5CC   Culture NO GROWTH 5 DAYS  Final   Report Status 04/11/2015 FINAL  Final  Urine culture     Status: None   Collection Time: 04/06/15  4:00 PM  Result Value Ref Range Status   Specimen Description URINE, CLEAN CATCH  Final   Special Requests NONE  Final   Culture NO GROWTH 1 DAY  Final   Report Status 04/07/2015 FINAL  Final  Culture, blood (routine x 2)     Status: None   Collection Time: 04/06/15  7:32 PM  Result Value Ref Range Status   Specimen Description BLOOD LEFT HAND  Final   Special Requests BOTTLES DRAWN AEROBIC AND ANAEROBIC 5CC   Final   Culture NO GROWTH 5 DAYS  Final   Report Status 04/11/2015 FINAL  Final     Scheduled Meds: . atorvastatin  40 mg Oral Daily  . enoxaparin (LOVENOX) injection  0.5 mg/kg Subcutaneous Q24H  . furosemide  40 mg Intravenous TID  . insulin aspart  0-15 Units Subcutaneous TID WC  . insulin aspart  0-5 Units Subcutaneous QHS  . insulin aspart  10 Units Subcutaneous TID WC  . insulin detemir  20 Units Subcutaneous BID  . ipratropium-albuterol  3 mL Nebulization BID  . levothyroxine  200 mcg Oral QAC breakfast  . mometasone-formoterol  2 puff Inhalation BID  . piperacillin-tazobactam (ZOSYN)  IV  3.375 g Intravenous Q8H  . vancomycin  1,250 mg Intravenous Q12H   Continuous Infusions:    Railee Bonillas, DO  [facility] Pager [phone]  If 7PM-7AM, please contact night-coverage [email or web address] Password [email or web address] 04/11/2015, 5:05 PM   LOS: 4 days

## 2015-04-12 DIAGNOSIS — E038 Other specified hypothyroidism: Secondary | ICD-10-CM

## 2015-04-12 LAB — BASIC METABOLIC PANEL
Anion gap: 12 (ref 5–15)
BUN: 18 mg/dL (ref 6–20)
CO2: 29 mmol/L (ref 22–32)
Calcium: 9.1 mg/dL (ref 8.9–10.3)
Chloride: 96 mmol/L — ABNORMAL LOW (ref 101–111)
Creatinine, Ser: 1.08 mg/dL (ref 0.61–1.24)
GFR calc Af Amer: >60 mL/min (ref >60)
GFR calc non Af Amer: >60 mL/min (ref >60)
Glucose, Bld: 219 mg/dL — ABNORMAL HIGH (ref 65–99)
Potassium: 3.7 mmol/L (ref 3.5–5.1)
Sodium: 137 mmol/L (ref 135–145)

## 2015-04-12 LAB — CBC
HCT: 39.8 % (ref 39.0–52.0)
Hemoglobin: 12.9 g/dL — ABNORMAL LOW (ref 13.0–17.0)
MCH: 30.4 pg (ref 26.0–34.0)
MCHC: 32.4 g/dL (ref 30.0–36.0)
MCV: 93.9 fL (ref 78.0–100.0)
Platelets: 555 10*3/uL — ABNORMAL HIGH (ref 150–400)
RBC: 4.24 MIL/uL (ref 4.22–5.81)
RDW: 14.1 % (ref 11.5–15.5)
WBC: 7.5 10*3/uL (ref 4.0–10.5)

## 2015-04-12 LAB — GLUCOSE, CAPILLARY
Glucose-Capillary: 172 mg/dL — ABNORMAL HIGH (ref 65–99)
Glucose-Capillary: 52 mg/dL — ABNORMAL LOW (ref 65–99)
Glucose-Capillary: 57 mg/dL — ABNORMAL LOW (ref 65–99)
Glucose-Capillary: 176 mg/dL — ABNORMAL HIGH (ref 65–99)
Glucose-Capillary: 397 mg/dL — ABNORMAL HIGH (ref 65–99)
Glucose-Capillary: 119 mg/dL — ABNORMAL HIGH (ref 65–99)

## 2015-04-12 MED ORDER — INSULIN DETEMIR 100 UNIT/ML ~~LOC~~ SOLN
22.0000 [IU] | Freq: Two times a day (BID) | SUBCUTANEOUS | Status: DC
  Administered 2015-04-12 – 2015-04-13 (×2): 22 [IU] via SUBCUTANEOUS
  Filled 2015-04-12 (×3): qty 0.22

## 2015-04-12 MED ORDER — FUROSEMIDE 40 MG PO TABS
40.0000 mg | ORAL_TABLET | Freq: Two times a day (BID) | ORAL | Status: DC
  Administered 2015-04-12 – 2015-04-13 (×2): 40 mg via ORAL
  Filled 2015-04-12 (×2): qty 1

## 2015-04-12 MED ORDER — INSULIN ASPART 100 UNIT/ML ~~LOC~~ SOLN
7.0000 [IU] | Freq: Three times a day (TID) | SUBCUTANEOUS | Status: DC
  Administered 2015-04-12 – 2015-04-13 (×2): 7 [IU] via SUBCUTANEOUS

## 2015-04-12 NOTE — Clinical Documentation Improvement (Signed)
Internal Medicine  The patient has an order for the following medication(s):  "Duoneb bid"  Please provide a corresponding diagnosis that supports the use of this medication if appropriate for this admission.  Thank you     Please exercise your independent, professional judgment when responding. A specific answer is not anticipated or expected.   Thank You,  [place] [phone]

## 2015-04-12 NOTE — Progress Notes (Signed)
Hypoglycemic Event  CBG: 52 Treatment: 15 GM carbohydrate snack  Symptoms: None  Follow-up CBG: Time: 12:26pm CBG Result:57  Treatment: additional 15 gm carb snack  Recheck at 1:15pm 176  Possible Reasons for Event: Unknown  Comments/MD notified:Dr. Tat    Michelene Heady

## 2015-04-12 NOTE — Progress Notes (Signed)
PROGRESS NOTE  Eduardo Long [id number] DOB: 04-03-1956 DOA: 04/06/2015 PCP: Orpah Melter, MD   Brief Summary  The patient is a 59 y.o. year-old male with history of diabetes mellitus type 2, asthma, hypothyroidism who presents with left foot and leg swelling and pain. About 9 days prior to admission, he noticed a blood blister on the fourth toe of the left foot which he attributed to an ill-fitting pair of shoes. He was seen the next day by a physician at which time his toe and forefoot had swelled some. He was sent to the hospital for evaluation where he had pus squeezed out of his toe. He was given a dose of antibiotics in the ER via IV and sent home with a prescription for clindamycin which he took for 4-5 days. Although his foot hurt when the pus was drained, he states he has not had much pain, however, his foot and leg became increasingly red and swollen. He was seen about 4 days prior to admission by his PCP who changed his antibiotics from clinda to bactrim + augmentin which he has been taking for the last few days. He was seen again by his PCP two days later and told that his foot looked worse and that he should come to the ER. He has not had fevers, chills, nausea, vomiting, or other systemic symptoms. He states he has had increasing wheezing, dyspnea, lower extremity edema over the last month. He has gained 30-lbs in teh last month which he attributes to his prednisone. He denies chest pressure, however, his wife states that he complained of chest tightness to her previously. He denies PND and orthopnea.   Ihe ER, VSS, WBC 6.6, lactic acid 2. His creatinine is 1.25 with previous baseline of 1 and he is mildly anemic. He had bilateral lower extremity swelling worse on the left foot with evidence of cellulitis of the foot, toes, and lower leg. He was started on vancomycin and zosyn and admitted for cellulitis which failed outpatient therapy.    Assessment/Plan  Left lower extremity cellulitis that failed outpatient management without evidence of sepsis, present at time of admission. Finally starting to show signs of improvement today.  - A1c 8.2 - HIV ab NR - BCx NGTD - d/c vancomycin and zosyn day 6 - 04/11/15--start doxycycline po  Bilateral lower extremity edema/acute diastolic heart failure,  -swelling improving with intravenous furosemide - suspect undiagnosed OSA - NEG 7.2 L for the admission--I/O not accurate past 24-48 hrs as pt has been urinating into commode and flushing - NEG 19 pounds in past 96 hour - ECHO demonstrated mild LVH with EF 65-70%, no WMA - UA neg protein - TSH 1.039 wnl - TED hose - Elevate legs - change IV to po lasix - Duplex lower extremities neg for DVT  Dyspnea, CXR without evidence of effusion or vascular congestion, however, this is improving quickly with diuresis. - ECHO as above - continue dulera and duonebs - Outpatient PFTs - Outpatient sleep study   Weight gain,  - likely secondary to steroids recently as well as CHF - Try to control breathing problems without pulse steroids - Encouraged diet and exercise for weight loss - Nutrition consultation appreciated  Diabetes mellitus type 2, uncontrolled. Patient states that he stopped taking his lantus 50 Units BID several months ago secondary to shortness of breath. He has also been making changes to his aspart insulin without the advice of his endocrinologist. He  snacks all day. Had tried to adjust his insulin closer to his home regimen initially but he became hypoglycemic.  - A1c 8.2 - increase levemir 22 units daily - reduce aspart 7 units with meals  - Continue moderate dose SSI and HS insulin - Greatly appreciate Diabetic educator assistance  Hyperlipidemia, stable, continue atorvastatin  Hypothyroidism - TSH wnl - Continue synthroid  Normocytic anemia - Iron studies, B12,  folate all wnl - TSH wnl - hgb stable  Hyponatremia due to hyperglycemia > corrects to normal  Diet: diabetic  Code Status: full Family Communication: wife updated at bedside 11/11 Disposition Plan: Home on 11/12 if stable   Procedures/Studies: Dg Chest 2 View  04/06/2015  CLINICAL DATA:  59 year old male with a history of swelling EXAM: CHEST - 2 VIEW COMPARISON:  04/29/2005 FINDINGS: Cardiomediastinal silhouette projects within normal limits in size and contour. No confluent airspace disease, pneumothorax, or pleural effusion. No displaced fracture. Unremarkable appearance of the upper abdomen. IMPRESSION: No radiographic evidence of acute cardiopulmonary disease. Signed, Dulcy Fanny. Earleen Newport, DO [facility] Electronically Signed   By: Corrie Mckusick D.O.   On: 04/06/2015 16:07         Subjective: Patient states that his edema is continuing to improve. He was able to walk around the unit several times today without any dyspnea. Denies any fevers, chills, chest discomfort, nausea, vomiting, diarrhea, abdominal pain. No dysuria or hematuria. No hematochezia or melena.  Objective: Filed Vitals:   04/12/15 0609 04/12/15 0708 04/12/15 0918 04/12/15 1322  BP: 106/73   119/81  Pulse: 99   104  Temp: 97.9 F (36.6 C)   98.3 F (36.8 C)  TempSrc: Oral   Oral  Resp: 16   18  Height:      Weight:  101.061 kg (222 lb 12.8 oz)    SpO2: 94%  95% 95%    Intake/Output Summary (Last 24 hours) at 04/12/15 1500 Last data filed at 04/12/15 1353  Gross per 24 hour  Intake   1550 ml  Output    875 ml  Net    675 ml   Weight change:  Exam:   General:  Pt is alert, follows commands appropriately, not in acute distress  HEENT: No icterus, No thrush, No neck mass, [place]/AT  Cardiovascular: RRR, S1/S2, no rubs, no gallops  Respiratory: CTA bilaterally, no wheezing, no crackles, no rhonchi  Abdomen: Soft/+BS, non tender, non  distended, no guarding; no hepatosplenomegaly  Extremities: 1 +LE edema, No lymphangitis, No petechiae, No rashes, no synovitis; no cyanosis or clubbing  Data Reviewed: Basic Metabolic Panel:  Recent Labs Lab 04/08/15 0533 04/09/15 0423 04/10/15 0600 04/11/15 0520 04/12/15 0428  NA 133* 140 139 137 137  K 4.2 3.8 3.7 3.8 3.7  CL 94* 99* 101 98* 96*  CO2 27 31 30 30 29   GLUCOSE 441* 157* 112* 131* 219*  BUN 14 10 12 14 18   CREATININE 1.22 1.00 0.97 1.06 1.08  CALCIUM 8.9 9.2 9.3 9.3 9.1   Liver Function Tests:  Recent Labs Lab 04/06/15 1608  AST 48*  ALT 60  ALKPHOS 85  BILITOT 0.4  PROT 6.2*  ALBUMIN 3.4*   No results for input(s): LIPASE, AMYLASE in the last 168 hours. No results for input(s): AMMONIA in the last 168 hours. CBC:  Recent Labs Lab 04/06/15 1608  04/08/15 0533 04/09/15 0423 04/10/15 0600 04/11/15 0520 04/12/15 0428  WBC 6.6  < > 6.7 6.0 6.3 6.5  7.5  NEUTROABS 3.6  --   --   --   --   --   --   HGB 12.6*  < > 12.2* 12.3* 13.0 13.4 12.9*  HCT 38.4*  < > 37.9* 38.7* 39.5 40.6 39.8  MCV 93.9  < > 95.2 95.1 94.5 94.4 93.9  PLT 380  < > 395 422* 474* 504* 555*  < > = values in this interval not displayed. Cardiac Enzymes:  Recent Labs Lab 04/11/15 0520  CKTOTAL 111   BNP: Invalid input(s): POCBNP CBG:  Recent Labs Lab 04/11/15 2117 04/12/15 0755 04/12/15 1205 04/12/15 1226 04/12/15 1315  GLUCAP 234* 172* 52* 57* 176*    Recent Results (from the past 240 hour(s))  Culture, blood (routine x 2)     Status: None   Collection Time: 04/06/15  3:38 PM  Result Value Ref Range Status   Specimen Description BLOOD LEFT ARM  Final   Special Requests   Final    BOTTLES DRAWN AEROBIC AND ANAEROBIC BLUE 10CC RED 5CC   Culture NO GROWTH 5 DAYS  Final   Report Status 04/11/2015 FINAL  Final  Urine culture     Status: None   Collection Time: 04/06/15  4:00 PM  Result Value Ref Range Status   Specimen Description URINE, CLEAN CATCH  Final    Special Requests NONE  Final   Culture NO GROWTH 1 DAY  Final   Report Status 04/07/2015 FINAL  Final  Culture, blood (routine x 2)     Status: None   Collection Time: 04/06/15  7:32 PM  Result Value Ref Range Status   Specimen Description BLOOD LEFT HAND  Final   Special Requests BOTTLES DRAWN AEROBIC AND ANAEROBIC 5CC  Final   Culture NO GROWTH 5 DAYS  Final   Report Status 04/11/2015 FINAL  Final     Scheduled Meds: . atorvastatin  40 mg Oral Daily  . doxycycline  100 mg Oral Q12H  . enoxaparin (LOVENOX) injection  0.5 mg/kg Subcutaneous Q24H  . furosemide  40 mg Oral BID  . insulin aspart  0-15 Units Subcutaneous TID WC  . insulin aspart  0-5 Units Subcutaneous QHS  . insulin aspart  7 Units Subcutaneous TID WC  . insulin detemir  20 Units Subcutaneous BID  . ipratropium-albuterol  3 mL Nebulization BID  . levothyroxine  200 mcg Oral QAC breakfast  . mometasone-formoterol  2 puff Inhalation BID   Continuous Infusions:    Sariya Trickey, DO  [facility] Pager [phone]  If 7PM-7AM, please contact night-coverage [email or web address] Password [email or web address] 04/12/2015, 3:00 PM   LOS: 5 days

## 2015-04-12 NOTE — Progress Notes (Signed)
Inpatient Diabetes Program Recommendations  AACE/ADA: New Consensus Statement on Inpatient Glycemic Control (2015)  Target Ranges:  Prepandial:   less than 140 mg/dL      Peak postprandial:   less than 180 mg/dL (1-2 hours)      Critically ill patients:  140 - 180 mg/dL   Review of Glycemic Control  Results for NICOLO, SZABLEWSKI (MRN [id number]) as of 04/12/2015 12:13  Ref. Range 04/12/2015 04:28  Glucose Latest Ref Range: 65-99 mg/dL 219 (H)     Inpatient Diabetes Program Recommendations:  Insulin - Basal: Increase Lantus to 22 units bid   Will continue to follow. Thank you. Lorenda Peck, RD, LDN, CDE Inpatient Diabetes Coordinator [phone]

## 2015-04-13 LAB — BASIC METABOLIC PANEL
Anion gap: 7 (ref 5–15)
BUN: 17 mg/dL (ref 6–20)
CO2: 31 mmol/L (ref 22–32)
Calcium: 9.1 mg/dL (ref 8.9–10.3)
Chloride: 101 mmol/L (ref 101–111)
Creatinine, Ser: 0.93 mg/dL (ref 0.61–1.24)
GFR calc Af Amer: >60 mL/min (ref >60)
GFR calc non Af Amer: >60 mL/min (ref >60)
Glucose, Bld: 83 mg/dL (ref 65–99)
Potassium: 3.7 mmol/L (ref 3.5–5.1)
Sodium: 139 mmol/L (ref 135–145)

## 2015-04-13 LAB — GLUCOSE, CAPILLARY: Glucose-Capillary: 153 mg/dL — ABNORMAL HIGH (ref 65–99)

## 2015-04-13 MED ORDER — INSULIN PEN NEEDLE 32G X 4 MM MISC: Status: AC

## 2015-04-13 MED ORDER — INSULIN ASPART 100 UNIT/ML ~~LOC~~ SOLN: 7.0000 [IU] | Freq: Three times a day (TID) | SUBCUTANEOUS | Status: DC

## 2015-04-13 MED ORDER — INSULIN DETEMIR 100 UNIT/ML FLEXPEN: 22.0000 [IU] | PEN_INJECTOR | Freq: Two times a day (BID) | SUBCUTANEOUS | Status: DC

## 2015-04-13 MED ORDER — INSULIN DETEMIR 100 UNIT/ML ~~LOC~~ SOLN: 22.0000 [IU] | Freq: Two times a day (BID) | SUBCUTANEOUS | Status: DC

## 2015-04-13 MED ORDER — DOXYCYCLINE HYCLATE 100 MG PO TABS: 100.0000 mg | ORAL_TABLET | Freq: Two times a day (BID) | ORAL | Status: DC

## 2015-04-13 MED ORDER — METOPROLOL SUCCINATE ER 25 MG PO TB24: 25.0000 mg | ORAL_TABLET | Freq: Every day | ORAL | Status: DC

## 2015-04-13 MED ORDER — INSULIN ASPART 100 UNIT/ML FLEXPEN: 7.0000 [IU] | PEN_INJECTOR | Freq: Three times a day (TID) | SUBCUTANEOUS | Status: DC

## 2015-04-13 MED ORDER — FUROSEMIDE 40 MG PO TABS: 40.0000 mg | ORAL_TABLET | Freq: Every day | ORAL | Status: DC

## 2015-04-13 NOTE — Discharge Summary (Addendum)
Physician Discharge Summary  Eduardo Long [id number] DOB: 1956/04/04 DOA: 04/06/2015  PCP: Orpah Melter, MD  Admit date: 04/06/2015 Discharge date: 04/13/2015  Recommendations for Outpatient Follow-up:  1. Pt will need to follow up with PCP in 2 weeks post discharge 2. Please obtain BMP 04/16/15  Discharge Diagnoses:  Left lower extremity cellulitis that failed outpatient management without evidence of sepsis, present at time of admission. Finally starting to show signs of improvement today.  - A1c 8.2 - HIV ab NR - BCx NGTD - d/c vancomycin and zosyn day 6 - 04/11/15--start doxycycline po -Home with doxycycline for 3 additional days to finish 10 days therapy  Bilateral lower extremity edema/acute diastolic heart failure,  -swelling improving with intravenous furosemide - suspect undiagnosed OSA - NEG 7.2 L for the admission--I/O not accurate past 24-48 hrs as pt has been urinating into commode and flushing - NEG 14 pounds in past 96 hour - ECHO demonstrated mild LVH with EF 65-70%, no WMA - UA neg protein - TSH 1.039 wnl - TED hose - Elevate legs - change IV to po lasix - Duplex lower extremities neg for DVT -Home with furosemide 40 mg daily -Discharge weight 227 pounds -home with metoprolol succinate 25 mg daily  Dyspnea, CXR without evidence of effusion or vascular congestion, however, this is improving quickly with diuresis. - ECHO as above - Overall improved with hypoxemia at the time of discharge - Outpatient PFTs - Outpatient sleep study   Weight gain,  - likely secondary to steroids recently as well as CHF - Encouraged diet and exercise for weight loss - Nutrition consultation appreciated  Diabetes mellitus type 2, uncontrolled. Patient states that he stopped taking his lantus 50 Units BID several months ago secondary to shortness of breath. He has also been making changes to his aspart insulin without the advice of his  endocrinologist. He snacks all day. Had tried to adjust his insulin closer to his home regimen initially but he became hypoglycemic.  - A1c 8.2 - increase levemir 22 units daily - reduce aspart 7 units with meals  - Continue moderate dose SSI and HS insulin - Greatly appreciate Diabetic educator assistance  Hyperlipidemia, stable, continue atorvastatin  Hypothyroidism - TSH wnl - Continue synthroid  Normocytic anemia - Iron studies, B12, folate all wnl - TSH wnl - hgb stable  Hyponatremia due to hyperglycemia > corrects to normal  Discharge Condition: Stable  Disposition: Home  Diet: Carbohydrate modified  Wt Readings from Last 3 Encounters:  04/13/15 103.012 kg (227 lb 1.6 oz)  03/29/15 107.049 kg (236 lb)  03/15/15 102.967 kg (227 lb)    History of present illness:  The patient is a 59 y.o. year-old male with history of diabetes mellitus type 2, asthma, hypothyroidism who presents with left foot and leg swelling and pain. About 9 days prior to admission, he noticed a blood blister on the fourth toe of the left foot which he attributed to an ill-fitting pair of shoes. He was seen the next day by a physician at which time his toe and forefoot had swelled some. He was sent to the hospital for evaluation where he had pus squeezed out of his toe. He was given a dose of antibiotics in the ER via IV and sent home with a prescription for clindamycin which he took for 4-5 days. Although his foot hurt when the pus was drained, he states he has not had much pain, however, his foot and leg became increasingly red and swollen. He  was seen about 4 days prior to admission by his PCP who changed his antibiotics from clinda to bactrim + augmentin which he has been taking for the last few days. He was seen again by his PCP two days later and told that his foot looked worse and that he should come to the ER. He has not had fevers, chills, nausea, vomiting, or other systemic  symptoms. He states he has had increasing wheezing, dyspnea, lower extremity edema over the last month. He has gained 30-lbs in teh last month which he attributes to his prednisone. He denies chest pressure, however, his wife states that he complained of chest tightness to her previously. He denies PND and orthopnea.   Ihe ER, VSS, WBC 6.6, lactic acid 2. His creatinine is 1.25 with previous baseline of 1 and he is mildly anemic. He had bilateral lower extremity swelling worse on the left foot with evidence of cellulitis of the foot, toes, and lower leg. He was started on vancomycin and zosyn and admitted for cellulitis which failed outpatient therapy.   Discharge Exam: Filed Vitals:   04/13/15 0507  BP: 100/61  Pulse: 80  Temp: 98.8 F (37.1 C)  Resp: 17   Filed Vitals:   04/12/15 2112 04/13/15 0507 04/13/15 0509 04/13/15 0720  BP: 105/76 100/61    Pulse: 98 80    Temp: 98.5 F (36.9 C) 98.8 F (37.1 C)    TempSrc: Oral Oral    Resp: 16 17    Height:      Weight:   103.012 kg (227 lb 1.6 oz)   SpO2: 95% 93%  95%   General: A&O x 3, NAD, pleasant, cooperative Cardiovascular: RRR, no rub, no gallop, no S3 Respiratory: CTAB, no wheeze, no rhonchi Abdomen:soft, nontender, nondistended, positive bowel sounds Extremities: 1+LE edema, No lymphangitis, no petechiae  Discharge Instructions      Discharge Instructions    Ambulatory referral to Nutrition and Diabetic Education    Complete by:  As directed             Medication List    STOP taking these medications        amoxicillin-clavulanate 875-125 MG tablet  Commonly known as:  AUGMENTIN     aspirin EC 325 MG tablet     insulin aspart 100 UNIT/ML injection  Commonly known as:  novoLOG  Replaced by:  insulin aspart 100 UNIT/ML FlexPen     sulfamethoxazole-trimethoprim 800-160 MG tablet  Commonly known as:  BACTRIM DS,SEPTRA DS      TAKE these medications        atorvastatin 40 MG tablet  Commonly  known as:  LIPITOR  Take 40 mg by mouth daily.     doxycycline 100 MG tablet  Commonly known as:  VIBRA-TABS  Take 1 tablet (100 mg total) by mouth every 12 (twelve) hours.     furosemide 40 MG tablet  Commonly known as:  LASIX  Take 1 tablet (40 mg total) by mouth daily.     insulin aspart 100 UNIT/ML FlexPen  Commonly known as:  NOVOLOG  Inject 7 Units into the skin 3 (three) times daily with meals.     Insulin Detemir 100 UNIT/ML Pen  Commonly known as:  LEVEMIR  Inject 22 Units into the skin 2 (two) times daily.     Insulin Pen Needle 32G X 4 MM Misc  Use with insulin pens to dispense insulin     levothyroxine 200 MCG tablet  Commonly known as:  SYNTHROID, LEVOTHROID  Take 200 mcg by mouth daily before breakfast.     metoprolol succinate 25 MG 24 hr tablet  Commonly known as:  TOPROL XL  Take 1 tablet (25 mg total) by mouth daily.     OVER THE COUNTER MEDICATION  Take 1 tablet by mouth at bedtime. Vitamin B6 or Vitamin B12 - OTC     PROAIR HFA 108 (90 BASE) MCG/ACT inhaler  Generic drug:  albuterol  Inhale 2 puffs into the lungs every 6 (six) hours as needed for wheezing or shortness of breath.     albuterol (2.5 MG/3ML) 0.083% nebulizer solution  Commonly known as:  PROVENTIL  Take 2.5 mg by nebulization every 6 (six) hours as needed for wheezing or shortness of breath.     vitamin C 500 MG tablet  Commonly known as:  ASCORBIC ACID  Take 500-1,000 mg by mouth at bedtime.     zolpidem 10 MG tablet  Commonly known as:  AMBIEN  Take 10 mg by mouth at bedtime as needed for sleep.         The results of significant diagnostics from this hospitalization (including imaging, microbiology, ancillary and laboratory) are listed below for reference.    Significant Diagnostic Studies: Dg Chest 2 View  04/06/2015  CLINICAL DATA:  59 year old male with a history of swelling EXAM: CHEST - 2 VIEW COMPARISON:  04/29/2005 FINDINGS: Cardiomediastinal silhouette projects  within normal limits in size and contour. No confluent airspace disease, pneumothorax, or pleural effusion. No displaced fracture. Unremarkable appearance of the upper abdomen. IMPRESSION: No radiographic evidence of acute cardiopulmonary disease. Signed, Dulcy Fanny. Earleen Newport, DO [facility] [place] [facility] Electronically Signed   By: Corrie Mckusick D.O.   On: 04/06/2015 16:07     Microbiology: Recent Results (from the past 240 hour(s))  Culture, blood (routine x 2)     Status: None   Collection Time: 04/06/15  3:38 PM  Result Value Ref Range Status   Specimen Description BLOOD LEFT ARM  Final   Special Requests   Final    BOTTLES DRAWN AEROBIC AND ANAEROBIC BLUE 10CC RED 5CC   Culture NO GROWTH 5 DAYS  Final   Report Status 04/11/2015 FINAL  Final  Urine culture     Status: None   Collection Time: 04/06/15  4:00 PM  Result Value Ref Range Status   Specimen Description URINE, CLEAN CATCH  Final   Special Requests NONE  Final   Culture NO GROWTH 1 DAY  Final   Report Status 04/07/2015 FINAL  Final  Culture, blood (routine x 2)     Status: None   Collection Time: 04/06/15  7:32 PM  Result Value Ref Range Status   Specimen Description BLOOD LEFT HAND  Final   Special Requests BOTTLES DRAWN AEROBIC AND ANAEROBIC 5CC  Final   Culture NO GROWTH 5 DAYS  Final   Report Status 04/11/2015 FINAL  Final     Labs: Basic Metabolic Panel:  Recent Labs Lab 04/09/15 0423 04/10/15 0600 04/11/15 0520 04/12/15 0428 04/13/15 0555  NA 140 139 137 137 139  K 3.8 3.7 3.8 3.7 3.7  CL 99* 101 98* 96* 101  CO2 31 30 30 29 31   GLUCOSE 157* 112* 131* 219* 83  BUN 10 12 14 18 17   CREATININE 1.00 0.97 1.06 1.08 0.93  CALCIUM 9.2 9.3 9.3 9.1 9.1   Liver Function Tests:  Recent Labs Lab 04/06/15 1608  AST 48*  ALT 60  ALKPHOS 85  BILITOT 0.4  PROT 6.2*  ALBUMIN 3.4*   No results for input(s): LIPASE, AMYLASE in the last 168 hours. No results for  input(s): AMMONIA in the last 168 hours. CBC:  Recent Labs Lab 04/06/15 1608  04/08/15 0533 04/09/15 0423 04/10/15 0600 04/11/15 0520 04/12/15 0428  WBC 6.6  < > 6.7 6.0 6.3 6.5 7.5  NEUTROABS 3.6  --   --   --   --   --   --   HGB 12.6*  < > 12.2* 12.3* 13.0 13.4 12.9*  HCT 38.4*  < > 37.9* 38.7* 39.5 40.6 39.8  MCV 93.9  < > 95.2 95.1 94.5 94.4 93.9  PLT 380  < > 395 422* 474* 504* 555*  < > = values in this interval not displayed. Cardiac Enzymes:  Recent Labs Lab 04/11/15 0520  CKTOTAL 111   BNP: Invalid input(s): POCBNP CBG:  Recent Labs Lab 04/12/15 1226 04/12/15 1315 04/12/15 1710 04/12/15 2101 04/13/15 0744  GLUCAP 57* 176* 397* 119* 153*    Time coordinating discharge:  Greater than 30 minutes  Signed:  Mael Delap, DO [facility] Pager: [phone] 04/13/2015, 1:25 PM

## 2015-04-13 NOTE — Progress Notes (Signed)
Patient discharge teaching given, including activity, diet, follow-up appoints, and medications. Teaching regarding lantus, insulin injection sites, blood glucose monitoring, signs of high and low blood sugar with teachback provided. Patient verbalized understanding of all discharge instructions. IV access was d/c'd. Vitals are stable. Skin is intact except as charted in most recent assessments. Pt to be escorted out by NT, to be driven home by family.

## 2015-05-17 ENCOUNTER — Encounter (HOSPITAL_COMMUNITY): Payer: Self-pay | Admitting: *Deleted

## 2015-05-20 ENCOUNTER — Encounter (HOSPITAL_BASED_OUTPATIENT_CLINIC_OR_DEPARTMENT_OTHER)
Admission: RE | Admit: 2015-05-20 | Discharge: 2015-05-20 | Disposition: A | Discharge status: Home / Self Care | Payer: BLUE CROSS/BLUE SHIELD | Source: Ambulatory Visit | Attending: Surgery | Admitting: Surgery

## 2015-05-20 DIAGNOSIS — D1779 Benign lipomatous neoplasm of other sites: Secondary | ICD-10-CM | POA: Diagnosis not present

## 2015-05-20 DIAGNOSIS — E7800 Pure hypercholesterolemia, unspecified: Secondary | ICD-10-CM | POA: Diagnosis not present

## 2015-05-20 DIAGNOSIS — G4730 Sleep apnea, unspecified: Secondary | ICD-10-CM | POA: Diagnosis not present

## 2015-05-20 DIAGNOSIS — E1165 Type 2 diabetes mellitus with hyperglycemia: Secondary | ICD-10-CM | POA: Diagnosis not present

## 2015-05-20 DIAGNOSIS — E039 Hypothyroidism, unspecified: Secondary | ICD-10-CM | POA: Diagnosis not present

## 2015-05-20 DIAGNOSIS — Z794 Long term (current) use of insulin: Secondary | ICD-10-CM | POA: Diagnosis not present

## 2015-05-20 DIAGNOSIS — Z79899 Other long term (current) drug therapy: Secondary | ICD-10-CM | POA: Diagnosis not present

## 2015-05-20 DIAGNOSIS — Z6834 Body mass index (BMI) 34.0-34.9, adult: Secondary | ICD-10-CM | POA: Diagnosis not present

## 2015-05-20 DIAGNOSIS — E669 Obesity, unspecified: Secondary | ICD-10-CM | POA: Diagnosis not present

## 2015-05-20 DIAGNOSIS — J45909 Unspecified asthma, uncomplicated: Secondary | ICD-10-CM | POA: Diagnosis not present

## 2015-05-20 LAB — BASIC METABOLIC PANEL
Anion gap: 9 (ref 5–15)
BUN: 16 mg/dL (ref 6–20)
CO2: 32 mmol/L (ref 22–32)
Calcium: 9.7 mg/dL (ref 8.9–10.3)
Chloride: 102 mmol/L (ref 101–111)
Creatinine, Ser: 0.95 mg/dL (ref 0.61–1.24)
GFR calc Af Amer: >60 mL/min (ref >60)
GFR calc non Af Amer: >60 mL/min (ref >60)
Glucose, Bld: 44 mg/dL — CL (ref 65–99)
Potassium: 3.7 mmol/L (ref 3.5–5.1)
Sodium: 143 mmol/L (ref 135–145)

## 2015-05-20 NOTE — Progress Notes (Signed)
Pt's glucose is 44, spoke with Dr. Al Corpus, gave specific instructions for morning of surgery, called patient - no answer and asked him to call tomorrow for specific instructions.

## 2015-05-21 NOTE — Progress Notes (Signed)
Spoke to [name]  Instructed to drink up till 1100 pm not afterward/  Eat light breakfast at 7 am and then come early 1230 to center, and do not take any diabetic med morning of surgery.  Pt voiced understanding.

## 2015-05-23 ENCOUNTER — Encounter (HOSPITAL_BASED_OUTPATIENT_CLINIC_OR_DEPARTMENT_OTHER): Payer: Self-pay | Admitting: Anesthesiology

## 2015-05-23 ENCOUNTER — Ambulatory Visit (HOSPITAL_COMMUNITY)
Admission: RE | Admit: 2015-05-23 | Discharge: 2015-05-23 | Disposition: A | Discharge status: Home / Self Care | Payer: BLUE CROSS/BLUE SHIELD | Source: Ambulatory Visit | Attending: Surgery | Admitting: Surgery

## 2015-05-23 ENCOUNTER — Ambulatory Visit (HOSPITAL_BASED_OUTPATIENT_CLINIC_OR_DEPARTMENT_OTHER): Payer: BLUE CROSS/BLUE SHIELD | Admitting: Anesthesiology

## 2015-05-23 ENCOUNTER — Encounter (HOSPITAL_BASED_OUTPATIENT_CLINIC_OR_DEPARTMENT_OTHER)
Admission: RE | Disposition: A | Discharge status: Home / Self Care | Payer: Self-pay | Source: Ambulatory Visit | Attending: Surgery

## 2015-05-23 DIAGNOSIS — Z6834 Body mass index (BMI) 34.0-34.9, adult: Secondary | ICD-10-CM | POA: Insufficient documentation

## 2015-05-23 DIAGNOSIS — E669 Obesity, unspecified: Secondary | ICD-10-CM | POA: Insufficient documentation

## 2015-05-23 DIAGNOSIS — D1779 Benign lipomatous neoplasm of other sites: Secondary | ICD-10-CM | POA: Diagnosis not present

## 2015-05-23 DIAGNOSIS — E1165 Type 2 diabetes mellitus with hyperglycemia: Secondary | ICD-10-CM | POA: Insufficient documentation

## 2015-05-23 DIAGNOSIS — E039 Hypothyroidism, unspecified: Secondary | ICD-10-CM | POA: Insufficient documentation

## 2015-05-23 DIAGNOSIS — Z79899 Other long term (current) drug therapy: Secondary | ICD-10-CM | POA: Insufficient documentation

## 2015-05-23 DIAGNOSIS — E7800 Pure hypercholesterolemia, unspecified: Secondary | ICD-10-CM | POA: Insufficient documentation

## 2015-05-23 DIAGNOSIS — J45909 Unspecified asthma, uncomplicated: Secondary | ICD-10-CM | POA: Insufficient documentation

## 2015-05-23 DIAGNOSIS — Z794 Long term (current) use of insulin: Secondary | ICD-10-CM | POA: Insufficient documentation

## 2015-05-23 DIAGNOSIS — G4730 Sleep apnea, unspecified: Secondary | ICD-10-CM | POA: Insufficient documentation

## 2015-05-23 HISTORY — DX: Essential (primary) hypertension: I10

## 2015-05-23 HISTORY — PX: LIPOMA EXCISION: SHX5283

## 2015-05-23 HISTORY — DX: Heart failure, unspecified: I50.9

## 2015-05-23 HISTORY — DX: Sleep apnea, unspecified: G47.30

## 2015-05-23 LAB — GLUCOSE, CAPILLARY
Glucose-Capillary: 197 mg/dL — ABNORMAL HIGH (ref 65–99)
Glucose-Capillary: 170 mg/dL — ABNORMAL HIGH (ref 65–99)
Glucose-Capillary: 144 mg/dL — ABNORMAL HIGH (ref 65–99)

## 2015-05-23 SURGERY — EXCISION LIPOMA
Anesthesia: General

## 2015-05-23 MED ORDER — LIDOCAINE HCL (CARDIAC) 20 MG/ML IV SOLN
INTRAVENOUS | Status: DC | PRN
  Administered 2015-05-23: 100 mg via INTRAVENOUS

## 2015-05-23 MED ORDER — EPHEDRINE SULFATE 50 MG/ML IJ SOLN
INTRAMUSCULAR | Status: DC | PRN
  Administered 2015-05-23: 15 mg via INTRAVENOUS

## 2015-05-23 MED ORDER — SUCCINYLCHOLINE CHLORIDE 20 MG/ML IJ SOLN
INTRAMUSCULAR | Status: DC | PRN
  Administered 2015-05-23: 50 mg via INTRAVENOUS

## 2015-05-23 MED ORDER — FENTANYL CITRATE (PF) 100 MCG/2ML IJ SOLN
50.0000 ug | INTRAMUSCULAR | Status: DC | PRN
  Administered 2015-05-23: 100 ug via INTRAVENOUS

## 2015-05-23 MED ORDER — SCOPOLAMINE 1 MG/3DAYS TD PT72: 1.0000 | MEDICATED_PATCH | Freq: Once | TRANSDERMAL | Status: DC | PRN

## 2015-05-23 MED ORDER — PROPOFOL 10 MG/ML IV BOLUS
INTRAVENOUS | Status: DC | PRN
  Administered 2015-05-23: 150 mg via INTRAVENOUS

## 2015-05-23 MED ORDER — OXYCODONE HCL 5 MG PO TABS
5.0000 mg | ORAL_TABLET | Freq: Once | ORAL | Status: AC | PRN
  Administered 2015-05-23: 5 mg via ORAL

## 2015-05-23 MED ORDER — FENTANYL CITRATE (PF) 100 MCG/2ML IJ SOLN
INTRAMUSCULAR | Status: AC
  Filled 2015-05-23: qty 2

## 2015-05-23 MED ORDER — 0.9 % SODIUM CHLORIDE (POUR BTL) OPTIME
TOPICAL | Status: DC | PRN
  Administered 2015-05-23: 100 mL

## 2015-05-23 MED ORDER — ONDANSETRON HCL 4 MG/2ML IJ SOLN: 4.0000 mg | Freq: Once | INTRAMUSCULAR | Status: DC | PRN

## 2015-05-23 MED ORDER — FENTANYL CITRATE (PF) 100 MCG/2ML IJ SOLN
25.0000 ug | INTRAMUSCULAR | Status: DC | PRN
  Administered 2015-05-23 (×2): 50 ug via INTRAVENOUS

## 2015-05-23 MED ORDER — PROPOFOL 10 MG/ML IV BOLUS
INTRAVENOUS | Status: AC
  Filled 2015-05-23: qty 20

## 2015-05-23 MED ORDER — MIDAZOLAM HCL 2 MG/2ML IJ SOLN
INTRAMUSCULAR | Status: AC
  Filled 2015-05-23: qty 2

## 2015-05-23 MED ORDER — OXYCODONE HCL 5 MG PO TABS
ORAL_TABLET | ORAL | Status: AC
  Filled 2015-05-23: qty 1

## 2015-05-23 MED ORDER — MIDAZOLAM HCL 2 MG/2ML IJ SOLN
1.0000 mg | INTRAMUSCULAR | Status: DC | PRN
  Administered 2015-05-23: 1 mg via INTRAVENOUS

## 2015-05-23 MED ORDER — PROMETHAZINE HCL 25 MG/ML IJ SOLN: 6.2500 mg | INTRAMUSCULAR | Status: DC | PRN

## 2015-05-23 MED ORDER — OXYCODONE HCL 5 MG/5ML PO SOLN: 5.0000 mg | Freq: Once | ORAL | Status: AC | PRN

## 2015-05-23 MED ORDER — DEXAMETHASONE SODIUM PHOSPHATE 10 MG/ML IJ SOLN
INTRAMUSCULAR | Status: AC
  Filled 2015-05-23: qty 1

## 2015-05-23 MED ORDER — LACTATED RINGERS IV SOLN
INTRAVENOUS | Status: DC
  Administered 2015-05-23: 13:00:00 via INTRAVENOUS

## 2015-05-23 MED ORDER — DEXAMETHASONE SODIUM PHOSPHATE 4 MG/ML IJ SOLN
INTRAMUSCULAR | Status: DC | PRN
  Administered 2015-05-23: 5 mg via INTRAVENOUS

## 2015-05-23 MED ORDER — BUPIVACAINE-EPINEPHRINE 0.25% -1:200000 IJ SOLN
INTRAMUSCULAR | Status: DC | PRN
  Administered 2015-05-23: 8 mL

## 2015-05-23 MED ORDER — SUCCINYLCHOLINE CHLORIDE 20 MG/ML IJ SOLN
INTRAMUSCULAR | Status: AC
  Filled 2015-05-23: qty 1

## 2015-05-23 MED ORDER — ONDANSETRON HCL 4 MG/2ML IJ SOLN
INTRAMUSCULAR | Status: AC
  Filled 2015-05-23: qty 2

## 2015-05-23 MED ORDER — HYDROCODONE-ACETAMINOPHEN 5-325 MG PO TABS: 1.0000 | ORAL_TABLET | Freq: Four times a day (QID) | ORAL | Status: DC | PRN

## 2015-05-23 MED ORDER — GLYCOPYRROLATE 0.2 MG/ML IJ SOLN: 0.2000 mg | Freq: Once | INTRAMUSCULAR | Status: DC | PRN

## 2015-05-23 MED ORDER — EPHEDRINE SULFATE 50 MG/ML IJ SOLN
INTRAMUSCULAR | Status: AC
  Filled 2015-05-23: qty 1

## 2015-05-23 MED ORDER — ONDANSETRON HCL 4 MG/2ML IJ SOLN
INTRAMUSCULAR | Status: DC | PRN
  Administered 2015-05-23: 4 mg via INTRAVENOUS

## 2015-05-23 MED ORDER — LIDOCAINE HCL (CARDIAC) 20 MG/ML IV SOLN
INTRAVENOUS | Status: AC
  Filled 2015-05-23: qty 5

## 2015-05-23 MED ORDER — CEFAZOLIN SODIUM-DEXTROSE 2-3 GM-% IV SOLR
2.0000 g | INTRAVENOUS | Status: AC
  Administered 2015-05-23: 2 g via INTRAVENOUS

## 2015-05-23 MED ORDER — CEFAZOLIN SODIUM-DEXTROSE 2-3 GM-% IV SOLR
INTRAVENOUS | Status: AC
  Filled 2015-05-23: qty 50

## 2015-05-23 SURGICAL SUPPLY — 42 items
BENZOIN TINCTURE PRP APPL 2/3 (GAUZE/BANDAGES/DRESSINGS) IMPLANT
BLADE SURG 10 STRL SS (BLADE) IMPLANT
BLADE SURG 15 STRL LF DISP TIS (BLADE) ×1 IMPLANT
BLADE SURG 15 STRL SS (BLADE) ×1
CANISTER SUCT 1200ML W/VALVE (MISCELLANEOUS) IMPLANT
CHLORAPREP W/TINT 26ML (MISCELLANEOUS) ×2 IMPLANT
COVER BACK TABLE 60X90IN (DRAPES) ×2 IMPLANT
COVER MAYO STAND STRL (DRAPES) ×2 IMPLANT
DECANTER SPIKE VIAL GLASS SM (MISCELLANEOUS) IMPLANT
DRAPE LAPAROTOMY 100X72 PEDS (DRAPES) ×2 IMPLANT
DRAPE UTILITY XL STRL (DRAPES) ×2 IMPLANT
ELECT COATED BLADE 2.86 ST (ELECTRODE) ×2 IMPLANT
ELECT REM PT RETURN 9FT ADLT (ELECTROSURGICAL) ×2
ELECTRODE REM PT RTRN 9FT ADLT (ELECTROSURGICAL) ×1 IMPLANT
GLOVE BIO SURGEON STRL SZ7.5 (GLOVE) ×2 IMPLANT
GLOVE BIOGEL M STRL SZ7.5 (GLOVE) ×2 IMPLANT
GLOVE BIOGEL PI IND STRL 7.0 (GLOVE) ×1 IMPLANT
GLOVE BIOGEL PI IND STRL 7.5 (GLOVE) ×1 IMPLANT
GLOVE BIOGEL PI IND STRL 8 (GLOVE) ×2 IMPLANT
GLOVE BIOGEL PI INDICATOR 7.0 (GLOVE) ×1
GLOVE BIOGEL PI INDICATOR 7.5 (GLOVE) ×1
GLOVE BIOGEL PI INDICATOR 8 (GLOVE) ×2
GLOVE ECLIPSE 8.0 STRL XLNG CF (GLOVE) ×2 IMPLANT
GOWN STRL REUS W/ TWL LRG LVL3 (GOWN DISPOSABLE) ×2 IMPLANT
GOWN STRL REUS W/TWL LRG LVL3 (GOWN DISPOSABLE) ×2
LIQUID BAND (GAUZE/BANDAGES/DRESSINGS) IMPLANT
NEEDLE HYPO 25X1 1.5 SAFETY (NEEDLE) ×2 IMPLANT
NS IRRIG 1000ML POUR BTL (IV SOLUTION) IMPLANT
PACK BASIN DAY SURGERY FS (CUSTOM PROCEDURE TRAY) ×2 IMPLANT
PENCIL BUTTON HOLSTER BLD 10FT (ELECTRODE) ×2 IMPLANT
SLEEVE SCD COMPRESS KNEE MED (MISCELLANEOUS) ×2 IMPLANT
SPONGE LAP 4X18 X RAY DECT (DISPOSABLE) ×2 IMPLANT
STAPLER VISISTAT 35W (STAPLE) IMPLANT
STRIP CLOSURE SKIN 1/2X4 (GAUZE/BANDAGES/DRESSINGS) IMPLANT
SUT MON AB 4-0 PC3 18 (SUTURE) ×2 IMPLANT
SUT VICRYL 3-0 CR8 SH (SUTURE) IMPLANT
SUT VICRYL AB 3 0 TIES (SUTURE) IMPLANT
SYR CONTROL 10ML LL (SYRINGE) ×2 IMPLANT
TOWEL OR 17X24 6PK STRL BLUE (TOWEL DISPOSABLE) ×4 IMPLANT
TOWEL OR NON WOVEN STRL DISP B (DISPOSABLE) ×2 IMPLANT
TUBE CONNECTING 20X1/4 (TUBING) IMPLANT
YANKAUER SUCT BULB TIP NO VENT (SUCTIONS) IMPLANT

## 2015-05-23 NOTE — H&P (Signed)
Eduardo Long 04/02/2015 4:20 PM Location: [facility] Patient #: [id number] DOB: [birth year] Married / Language: English / Race: White Male   History of Present Illness Marcello Moores A. Kishon Garriga MD; 1 Patient words: lipoma head/neck      Patient returns to office for follow-up of lipoma over his posterior scalp and left side and a new lipoma over his frontal skull region picked up on CT scan recently. He was seen in May 2016 and wish to put off surgery. He has been seen for hemorrhoid issues and currently has some issues with carpal tunnel and cellulitis of his left foot that is trying to clear up. He wishes to get both lipomas removed one from his posterior scalp and the second from his anterior scalp prior to the new year.        CLINICAL DATA: Scalp lipomas. EXAM: CT HEAD WITHOUT AND WITH CONTRAST TECHNIQUE: Contiguous axial images were obtained from the base of the skull through the vertex without and with intravenous contrast CONTRAST: 61mL ISOVUE-300 IOPAMIDOL (ISOVUE-300) INJECTION 61% COMPARISON: None. FINDINGS: A lipoma in the left posterior occipital scalp measures 5.8 x 1.6 x 5.5 cm. There are several septations within this lipoma. No enhancing soft tissue is present. A 1.4 cm right frontal scalp lipoma is also present. No other focal lipomatous lesions are present. Scalp is otherwise within normal limits. A fluid level is present in the left maxillary sinus. There is scattered fluid throughout the ethmoid air cells bilaterally. There is some mucosal thickening into the inferior frontal sinuses bilaterally. The mastoid air cells are clear. The calvarium is intact. Mild physiologic calcifications are present within the dentate nuclei bilaterally. No acute infarct, hemorrhage, or mass lesion is present. The ventricles are of normal size. The basal ganglia are intact. The cortex is unremarkable. No significant extra-axial fluid collection is present. The  postcontrast images demonstrate no pathologic enhancement. IMPRESSION: 1. 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 There no enhancing lesions to suggest malignancy. Recommend continued clinical all observation for any growth of this leak and. 2. More simple appearing lipoma in the anterior right frontal scalp. 3. Normal CT appearance of the brain for age. Electronically Signed By: San Morelle M.D. On: 03/13/2015 14:03     Vitals Height Weight BMI (Calculated) 5\' 11"  (1.803 m) 107.049 kg (236 lb) 33  Interpretation Summary CLINICAL DATA: Scalp lipomas. EXAM: CT HEAD WITHOUT AND WITH CONTRAST TECHNIQUE: Contiguous axial images were obtained from the base of the skull through the vertex without and with intravenous contrast CONTRAST: 52mL ISOVUE-300 IOPAMIDOL (ISOVUE-300) INJECTION 61% COMPARISON: None. FINDINGS: A lipoma in the left posterior occipital scalp measures 5.8 x 1.6 x 5.5 cm. There are several septations within this lipoma. No enhancing soft tissue is present. A 1.4 cm right frontal scalp lipoma is also present. No other focal lipomatous lesions are present. Scalp is otherwise within normal limits. A fluid level is present in the left maxillary sinus. There is scattered fluid throughout the ethmoid air cells bilaterally. There is some mucosal thickening into the inferior frontal sinuses bilaterally. The mastoid air cells are clear. The calvarium is intact. Mild physiologic calcifications are present within the dentate nuclei bilaterally. No acute infarct, hemorrhage, or mass lesion is present. The ventricles are of normal size. The basal ganglia are intact. The cortex is unremarkable. No significant extra-axial fluid collection is present. The postcontrast images demonstrate no pathologic enhancement. IMPRESSION: 1. 5.8 x 1.6 x 5.5 cm complex lipoma  within the left occipital scalp. There no enhancing lesions to suggest malignancy.  Recommend continued clinical all observation for any growth of this leak and. 2. More simple appearing lipoma in the anterior right frontal scalp. 3. Normal CT appearance of the brain for age. Electronically Signed By: San Morelle M.D. On: 03/13/2015 14:03 .  The patient is a 59 year old male.   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 Levothyroxine Sodium (200MCG Tablet, Oral) Active. NovoLOG FlexPen (100UNIT/ML Soln Pen-inj, Subcutaneous) Active. Medications Reconciled ProAir HFA (108 (90 Base)MCG/ACT Aerosol Soln, Inhalation) Active. Zolpidem Tartrate (10MG  Tablet, Oral) Active.  Social History ([place], [place];  4:21 PM) No drug use Tobacco use Never smoker. Caffeine use Carbonated beverages, Coffee, Tea. Illicit drug use Remotely quit drug use. Alcohol use Moderate alcohol use, Remotely quit alcohol use.  Family History Ventura Sellers, [place];  Colon Cancer Father. Diabetes Mellitus Brother.  Vitals [employer] R. Brooks CMA4:20 PM) 04/02/2015 4:20 PM Weight: 244.38 lb Height: 71in Body Surface Area: 2.3 m Body Mass Index: 34.08 kg/m  BP: 136/78 (Sitting, Left Arm, Standard)       Physical Exam (Kenasia Scheller A. Jamy Cleckler MD; 4:47 PM) General Mental Status-Alert. General Appearance-Consistent with  stated age. Hydration-Well hydrated. Voice-Normal.  Integumentary Note: 2 CM LIPOMA MOBILE    Head and Neck Note: 8 CM LIPOMA MOBILE    Musculoskeletal Note: BILATERAL PITTING EDEMA OF BOTH LEGS LEFT LEG ERYTHEMA NOTED NO CREPITENCE   Lymphatic General Lymphatics -Note: Bilateral lower extremity lymphedema.     Assessment & Plan (Treniece Holsclaw A. 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 Risk of bleeding, infection, nerve injury, cosmetic deformity, loss of use of facial muscles, recurrence, and the need for other operative procedures discussed. He agrees to proceed. Current Plans The pathophysiology of skin & subcutaneous masses was discussed. Natural history risks without surgery were discussed. I recommended surgery to remove the mass. I explained the technique of removal with use of local anesthesia & possible need for more aggressive sedation/anesthesia for patient comfort.  Risks such as bleeding, infection, wound breakdown, heart attack, death, and other risks were discussed. I noted a good likelihood this will help address the problem. Possibility that this will not correct all symptoms was explained. Possibility of regrowth/recurrence of the mass was discussed. We will work to minimize complications. Questions were answered. The patient expresses understanding & wishes to proceed with surgery.  Pt Education - [facility] Free Text Education/Instructions: discussed with patient and provided information. Pt Education - Overview of benign lesions of the skin: discussed with patient and provided information. CELLULITIS OF FOOT (L03.119) Impression: Patient following up with primary care doctor tomorrow. He was placed on antibiotics by his hand surgeon last week. Denies pain or difficulty walking but the redness is not improved he  states. No fever  or chills and nontoxic    Signed by Turner Daniels, MD )

## 2015-05-23 NOTE — Interval H&P Note (Signed)
History and Physical Interval Note:  05/23/2015 2:48 PM  Eduardo Long  has presented today for surgery, with the diagnosis of LIPOMA X2  The various methods of treatment have been discussed with the patient and family. After consideration of risks, benefits and other options for treatment, the patient has consented to  Procedure(s): [place] (N/A) as a surgical intervention .  The patient's history has been reviewed, patient examined, no change in status, stable for surgery.  I have reviewed the patient's chart and labs.  Questions were answered to the patient's satisfaction.     Ciin Brazzel A.

## 2015-05-23 NOTE — Discharge Instructions (Signed)
GENERAL SURGERY: POST OP INSTRUCTIONS ° °1. DIET: Follow a light bland diet the first 24 hours after arrival home, such as soup, liquids, crackers, etc.  Be sure to include lots of fluids daily.  Avoid fast food or heavy meals as your are more likely to get nauseated.   °2. Take your usually prescribed home medications unless otherwise directed. °3. PAIN CONTROL: °a. Pain is best controlled by a usual combination of three different methods TOGETHER: °i. Ice/Heat °ii. Over the counter pain medication °iii. Prescription pain medication °b. Most patients will experience some swelling and bruising around the incisions.  Ice packs or heating pads (30-60 minutes up to 6 times a day) will help. Use ice for the first few days to help decrease swelling and bruising, then switch to heat to help relax tight/sore spots and speed recovery.  Some people prefer to use ice alone, heat alone, alternating between ice & heat.  Experiment to what works for you.  Swelling and bruising can take several weeks to resolve.   °c. It is helpful to take an over-the-counter pain medication regularly for the first few weeks.  Choose one of the following that works best for you: °i. Naproxen (Aleve, etc)  Two 220mg tabs twice a day °ii. Ibuprofen (Advil, etc) Three 200mg tabs four times a day (every meal & bedtime) °iii. Acetaminophen (Tylenol, etc) 500-650mg four times a day (every meal & bedtime) °d. A  prescription for pain medication (such as oxycodone, hydrocodone, etc) should be given to you upon discharge.  Take your pain medication as prescribed.  °i. If you are having problems/concerns with the prescription medicine (does not control pain, nausea, vomiting, rash, itching, etc), please call us [phone] to see if we need to switch you to a different pain medicine that will work better for you and/or control your side effect better. °ii. If you need a refill on your pain medication, please contact your pharmacy.  They will contact our  office to request authorization. Prescriptions will not be filled after 5 pm or on week-ends. °4. Avoid getting constipated.  Between the surgery and the pain medications, it is common to experience some constipation.  Increasing fluid intake and taking a fiber supplement (such as Metamucil, Citrucel, FiberCon, MiraLax, etc) 1-2 times a day regularly will usually help prevent this problem from occurring.  A mild laxative (prune juice, Milk of Magnesia, MiraLax, etc) should be taken according to package directions if there are no bowel movements after 48 hours.   °5. Wash / shower every day.  You may shower over the dressings as they are waterproof.  Continue to shower over incision(s) after the dressing is off. °6. Remove your waterproof bandages 5 days after surgery.  You may leave the incision open to air.  You may have skin tapes (Steri Strips) covering the incision(s).  Leave them on until one week, then remove.  You may replace a dressing/Band-Aid to cover the incision for comfort if you wish.  ° ° ° ° °7. ACTIVITIES as tolerated:   °a. You may resume regular (light) daily activities beginning the next day--such as daily self-care, walking, climbing stairs--gradually increasing activities as tolerated.  If you can walk 30 minutes without difficulty, it is safe to try more intense activity such as jogging, treadmill, bicycling, low-impact aerobics, swimming, etc. °b. Save the most intensive and strenuous activity for last such as sit-ups, heavy lifting, contact sports, etc  Refrain from any heavy lifting or straining until you   are off narcotics for pain control.   °c. DO NOT PUSH THROUGH PAIN.  Let pain be your guide: If it hurts to do something, don't do it.  Pain is your body warning you to avoid that activity for another week until the pain goes down. °d. You may drive when you are no longer taking prescription pain medication, you can comfortably wear a seatbelt, and you can safely maneuver your car and  apply brakes. °e. You may have sexual intercourse when it is comfortable.  °8. FOLLOW UP in our office °a. Please call [facility] at [phone] to set up an appointment to see your surgeon in the office for a follow-up appointment approximately 2-3 weeks after your surgery. °b. Make sure that you call for this appointment the day you arrive home to insure a convenient appointment time. °9. IF YOU HAVE DISABILITY OR FAMILY LEAVE FORMS, BRING THEM TO THE OFFICE FOR PROCESSING.  DO NOT GIVE THEM TO YOUR DOCTOR. ° ° °WHEN TO CALL US [phone]: °1. Poor pain control °2. Reactions / problems with new medications (rash/itching, nausea, etc)  °3. Fever over 101.5 F (38.5 C) °4. Worsening swelling or bruising °5. Continued bleeding from incision. °6. Increased pain, redness, or drainage from the incision °7. Difficulty breathing / swallowing ° ° The clinic staff is available to answer your questions during regular business hours (8:30am-5pm).  Please don’t hesitate to call and ask to speak to one of our nurses for clinical concerns.  ° If you have a medical emergency, go to the nearest emergency room or call 911. ° A surgeon from [facility] is always on call at the hospitals ° ° °[facility] °[address], [address], [place], [place]  [address] ? °MAIN: [phone] ? TOLL FREE: [phone] ?  °FAX [phone] °[email or web address] ° °Post Anesthesia Home Care Instructions ° °Activity: °Get plenty of rest for the remainder of the day. A responsible adult should stay with you for 24 hours following the procedure.  °For the next 24 hours, DO NOT: °-Drive a car °-Operate machinery °-Drink alcoholic beverages °-Take any medication unless instructed by your physician °-Make any legal decisions or sign important papers. ° °Meals: °Start with liquid foods such as gelatin or soup. Progress to regular foods as tolerated. Avoid greasy, spicy, heavy foods. If nausea and/or  vomiting occur, drink only clear liquids until the nausea and/or vomiting subsides. Call your physician if vomiting continues. ° °Special Instructions/Symptoms: °Your throat may feel dry or sore from the anesthesia or the breathing tube placed in your throat during surgery. If this causes discomfort, gargle with warm salt water. The discomfort should disappear within 24 hours. ° °If you had a scopolamine patch placed behind your ear for the management of post- operative nausea and/or vomiting: ° °1. The medication in the patch is effective for 72 hours, after which it should be removed.  Wrap patch in a tissue and discard in the trash. Wash hands thoroughly with soap and water. °2. You may remove the patch earlier than 72 hours if you experience unpleasant side effects which may include dry mouth, dizziness or visual disturbances. °3. Avoid touching the patch. Wash your hands with soap and water after contact with the patch. °  ° °

## 2015-05-23 NOTE — Op Note (Signed)
Preoperative diagnosis: Posterior lipoma posterior left scalp 5 cm  and 1 cm lipoma over right for head  Postop diagnosis: Same  Procedure excision of left scalp lipoma posterior and excision of right for head lipoma  Surgeon: Erroll Luna M.D.  Anesthesia: Gen. endotracheal anesthesia with 0.25% Sensorcaine local with epinephrine  EBL: Minimal  Specimen: Multiple lipoma to pathology fragments  Drains: None  Indications for procedure: The patient presents for lipoma excision over his left posterior scalp and a small 1 cm lipoma over his right for it. These are causing discomfort he wished to have been removed. Risk of bleeding, infection, need for more surgery, nerve injury leading to facial weakness or shoulder weakness noted, drooping face afterwards, swelling of the face, need for further surgery, recurrence, and the creation of new lipoma. Patient voices understanding and agreed to proceed.  Description of procedure the patient was brought to the operating room. Prior to this, both lipoma or encircled with a needle and initial box placed around both of them. Once was brought back the operating room general anesthesia was initiated. He was then placed prone. The area was was clearly marked the area was shaved. This was prepped and draped in sterile fashion and timeout was done to the left posterior scalp. Transverse incision was made of the large mass. A lobulated lipoma was encountered and removed in a piecemeal fashion. The areas made hemostatic and closed with 3-0 Vicryl for Monocryl. The lipoma was superficial to the musculature. No evidence of nerve injury or blood vessel injury.  The patient was then flipped supine. The able the area over his right for head was already marked preoperatively. Is there is prepped and draped in a sterile fashion. A very small incision over the lipoma was noted there. Transverse incision was made. 1 cm incision. Dissection was carried into subcutaneous fat  lobulated fatty mass was removed in a piecemeal fashion. Care was taken not to go too deep to injure the facia nerve branch to the right forehead. . The wound was hemostatic. Is closed with 3-0 Vicryl. A liquid adhesive was applied to both. All final counts are found to be correct. The patient was awoke extubated taken recovery in satisfactory condition.

## 2015-05-23 NOTE — Anesthesia Preprocedure Evaluation (Addendum)
Anesthesia Evaluation  Patient identified by MRN, date of birth, ID band Patient awake    Reviewed: Allergy & Precautions, NPO status , Patient's Chart, lab work & pertinent test results  History of Anesthesia Complications Negative for: history of anesthetic complications  Airway Mallampati: II  TM Distance: >3 FB Neck ROM: Full    Dental no notable dental hx. (+) Dental Advisory Given   Pulmonary asthma , sleep apnea , former smoker,    Pulmonary exam normal breath sounds clear to auscultation       Cardiovascular hypertension, Pt. on medications +CHF  Normal cardiovascular exam Rhythm:Regular Rate:Normal  Echo 04/2015   Neuro/Psych negative neurological ROS  negative psych ROS   GI/Hepatic negative GI ROS, Neg liver ROS,   Endo/Other  diabetes, Poorly Controlled, Insulin DependentHypothyroidism obesity  Renal/GU negative Renal ROS  negative genitourinary   Musculoskeletal negative musculoskeletal ROS (+)   Abdominal   Peds negative pediatric ROS (+)  Hematology negative hematology ROS (+)   Anesthesia Other Findings   Reproductive/Obstetrics negative OB ROS                           Anesthesia Physical Anesthesia Plan  ASA: III  Anesthesia Plan: General   Post-op Pain Management:    Induction: Intravenous  Airway Management Planned: Oral ETT  Additional Equipment:   Intra-op Plan:   Post-operative Plan: Extubation in OR  Informed Consent: I have reviewed the patients History and Physical, chart, labs and discussed the procedure including the risks, benefits and alternatives for the proposed anesthesia with the patient or authorized representative who has indicated his/her understanding and acceptance.   Dental advisory given  Plan Discussed with: CRNA  Anesthesia Plan Comments:        Anesthesia Quick Evaluation

## 2015-05-23 NOTE — Anesthesia Postprocedure Evaluation (Signed)
Anesthesia Post Note  Patient: Eduardo Long  Procedure(s) Performed: Procedure(s) (LRB): EXCISION LIPOMAS POSTERIOR AND ANTERIOR SCALP (N/A)  Patient location during evaluation: PACU Anesthesia Type: General Level of consciousness: awake and alert Pain management: pain level controlled Vital Signs Assessment: post-procedure vital signs reviewed and stable Respiratory status: spontaneous breathing, nonlabored ventilation, respiratory function stable and patient connected to nasal cannula oxygen Cardiovascular status: blood pressure returned to baseline and stable Postop Assessment: no signs of nausea or vomiting Anesthetic complications: no    Last Vitals:  Filed Vitals:   05/23/15 1630 05/23/15 1645  BP: 131/74 126/87  Pulse: 92 87  Temp:    Resp: 13 10    Last Pain:  Filed Vitals:   05/23/15 1649  PainSc: 5                  Delphin Funes JENNETTE

## 2015-05-23 NOTE — Anesthesia Procedure Notes (Signed)
Procedure Name: Intubation Date/Time: 05/23/2015 3:19 PM Performed by: Marrianne Mood Pre-anesthesia Checklist: Patient identified, Emergency Drugs available, Suction available, Patient being monitored and Timeout performed Patient Re-evaluated:Patient Re-evaluated prior to inductionOxygen Delivery Method: Circle System Utilized Preoxygenation: Pre-oxygenation with 100% oxygen Intubation Type: IV induction Ventilation: Mask ventilation without difficulty Laryngoscope Size: Miller Tube type: Oral Tube size: 8.0 mm Number of attempts: 1 Airway Equipment and Method: Stylet and Oral airway Placement Confirmation: ETT inserted through vocal cords under direct vision,  positive ETCO2 and breath sounds checked- equal and bilateral Secured at: 22 cm Tube secured with: Tape Dental Injury: Teeth and Oropharynx as per pre-operative assessment

## 2015-05-23 NOTE — Transfer of Care (Signed)
Immediate Anesthesia Transfer of Care Note  Patient: Eduardo Long  Procedure(s) Performed: Procedure(s): EXCISION LIPOMAS POSTERIOR AND ANTERIOR SCALP (N/A)  Patient Location: PACU  Anesthesia Type:General  Level of Consciousness: awake, alert  and oriented  Airway & Oxygen Therapy: Patient Spontanous Breathing and Patient connected to face mask oxygen  Post-op Assessment: Report given to RN and Post -op Vital signs reviewed and stable  Post vital signs: Reviewed and stable  Last Vitals:  Filed Vitals:   05/23/15 1257 05/23/15 1619  BP: 122/75   Pulse: 85 100  Temp: 36.7 C   Resp: 20 13    Complications: No apparent anesthesia complications

## 2015-05-24 ENCOUNTER — Encounter (HOSPITAL_BASED_OUTPATIENT_CLINIC_OR_DEPARTMENT_OTHER): Payer: Self-pay | Admitting: Surgery

## 2015-06-28 ENCOUNTER — Ambulatory Visit (INDEPENDENT_AMBULATORY_CARE_PROVIDER_SITE_OTHER): Payer: BLUE CROSS/BLUE SHIELD | Admitting: Cardiovascular Disease

## 2015-06-28 ENCOUNTER — Ambulatory Visit: Payer: BLUE CROSS/BLUE SHIELD | Admitting: Cardiovascular Disease

## 2015-06-28 ENCOUNTER — Telehealth: Payer: Self-pay | Admitting: Cardiovascular Disease

## 2015-06-28 ENCOUNTER — Encounter: Payer: Self-pay | Admitting: *Deleted

## 2015-06-28 ENCOUNTER — Encounter: Payer: Self-pay | Admitting: Cardiovascular Disease

## 2015-06-28 VITALS — BP 115/75 | HR 84 | Ht 70.0 in | Wt 239.0 lb

## 2015-06-28 DIAGNOSIS — R062 Wheezing: Secondary | ICD-10-CM

## 2015-06-28 DIAGNOSIS — Z87898 Personal history of other specified conditions: Secondary | ICD-10-CM

## 2015-06-28 DIAGNOSIS — Z9289 Personal history of other medical treatment: Secondary | ICD-10-CM

## 2015-06-28 DIAGNOSIS — R0609 Other forms of dyspnea: Secondary | ICD-10-CM | POA: Diagnosis not present

## 2015-06-28 DIAGNOSIS — R5383 Other fatigue: Secondary | ICD-10-CM | POA: Diagnosis not present

## 2015-06-28 DIAGNOSIS — R0600 Dyspnea, unspecified: Secondary | ICD-10-CM

## 2015-06-28 DIAGNOSIS — R600 Localized edema: Secondary | ICD-10-CM

## 2015-06-28 DIAGNOSIS — E785 Hyperlipidemia, unspecified: Secondary | ICD-10-CM

## 2015-06-28 NOTE — Patient Instructions (Signed)
Your physician recommends that you schedule a follow-up appointment TO BE DETERMINED AFTER TESTING  Your physician recommends that you continue on your current medications as directed. Please refer to the Current Medication list given to you today.  Your physician has requested that you have a dobutamine myoview. For furth information please visit [email or web address]. Please follow instruction sheet, as given.  Thank you for choosing [facility]!!

## 2015-06-28 NOTE — Telephone Encounter (Signed)
Dobutamine cardiolite 2 DAY PROTOCOL/koneswaran Forestine Na  Feb 3 arrival 10:30 Patient requested a Friday due to work

## 2015-06-28 NOTE — Progress Notes (Signed)
Patient ID: Eduardo Long, male   DOB: 1955/08/31, 60 y.o.   MRN: [id number]       CARDIOLOGY CONSULT NOTE  Patient ID: Eduardo Long MRN: [id number] DOB/AGE: 1955-10-15 60 y.o.  Admit date: (Not on file) Primary Physician Orpah Melter, MD  Reason for Consultation: ?chronic diastolic heart failure  HPI: The patient is a 60 year old male with a history of insulin-dependent diabetes mellitus and hypothyroidism who was reportedly hospitalized for "acute diastolic heart failure" in November 2016. Echocardiogram performed on 04/07/15 demonstrated normal left ventricular systolic function, LVEF [id number], normal regional wall motion, mild LVH, and reportedly normal diastolic function. ECG on 04/06/15 demonstrated sinus rhythm with a right bundle branch block. His presentation was consistent with bilateral lower extremity edema. Venous Dopplers on 04/07/15 were negative for DVT. BNP was normal at 50.5. Chest x-ray did not show any evidence of pulmonary edema nor any other acute cardiopulmonary disease. He was also noted to have left lower extremity cellulitis at that time and was placed on intravenous antibiotics.  He is currently taking Lasix 40 mg daily.  Denies exertional chest pain. His primary complaint relates to wheezing. He tells me has tried prednisone in the past and while he gave him relief initially, he then says "it messed me up". 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 He does not use a respirator because he says it makes him "sweaty and hot". He says he has severe sleep apnea and now uses CPAP. He has noticeable nasal congestion and does have a cat at home. He denies exertional chest pain. He does have exertional dyspnea.  Leg swelling has been controlled.  No Known Allergies  Current Outpatient Prescriptions  Medication Sig Dispense Refill  . albuterol (PROAIR HFA) 108 (90 BASE) MCG/ACT inhaler Inhale 2 puffs into the  lungs every 6 (six) hours as needed for wheezing or shortness of breath.    Marland Kitchen albuterol (PROVENTIL) (2.5 MG/3ML) 0.083% nebulizer solution Take 2.5 mg by nebulization every 6 (six) hours as needed for wheezing or shortness of breath.    Marland Kitchen atorvastatin (LIPITOR) 40 MG tablet Take 40 mg by mouth daily.    . furosemide (LASIX) 40 MG tablet Take 1 tablet (40 mg total) by mouth daily. 30 tablet 1  . HYDROcodone-acetaminophen (NORCO) 5-325 MG tablet Take 1 tablet by mouth every 6 (six) hours as needed for moderate pain. 30 tablet 0  . insulin aspart (NOVOLOG) 100 UNIT/ML FlexPen Inject 7 Units into the skin 3 (three) times daily with meals. 15 mL 1  . Insulin Detemir (LEVEMIR) 100 UNIT/ML Pen Inject 22 Units into the skin 2 (two) times daily. 15 mL 1  . Insulin Pen Needle 32G X 4 MM MISC Use with insulin pens to dispense insulin 100 each 9  . levothyroxine (SYNTHROID, LEVOTHROID) 200 MCG tablet Take 200 mcg by mouth daily before breakfast.    . metoprolol succinate (TOPROL XL) 25 MG 24 hr tablet Take 1 tablet (25 mg total) by mouth daily. 30 tablet 1  . OVER THE COUNTER MEDICATION Take 1 tablet by mouth at bedtime. Vitamin B6 or Vitamin B12 - OTC    . vitamin C (ASCORBIC ACID) 500 MG tablet Take 500-1,000 mg by mouth at bedtime.    Marland Kitchen zolpidem (AMBIEN) 10 MG tablet Take 10 mg by mouth at bedtime as needed for sleep.   5   No current facility-administered medications for this visit.    Past  Medical History  Diagnosis Date  . Diabetes mellitus without complication ([place])   . Asthma   . Hypothyroidism   . Hypertension   . CHF (congestive heart failure) ([place])     adm 04-12-15  . Sleep apnea     had sleep test 04-16-15, no results yet    Past Surgical History  Procedure Laterality Date  . Colonoscopy    . Left carpal tunnel    . Sinus endo with fusion Bilateral 03/15/2015    Procedure: ENDOSCOPIC SINUS SURGERY WITH FUSION;  Surgeon: Melida Quitter, MD;  Location: [facility];   Service: ENT;  Laterality: Bilateral;  . Carpal tunnel release Right 03/29/2015    Procedure: RIGHT CARPAL TUNNEL RELEASE;  Surgeon: Roseanne Kaufman, MD;  Location: [facility];  Service: Orthopedics;  Laterality: Right;  . Lipoma excision N/A 05/23/2015    Procedure: EXCISION LIPOMAS POSTERIOR AND ANTERIOR SCALP;  Surgeon: Erroll Luna, MD;  Location: [facility] [facility];  Service: General;  Laterality: N/A;    Social History   Social History  . Marital Status: Married    Spouse Name: N/A  . Number of Children: N/A  . Years of Education: N/A   Occupational History  . Not on file.   Social History Main Topics  . Smoking status: Never Smoker   . Smokeless tobacco: Never Used     Comment: Smoked off and on nothing recent  . Alcohol Use: No  . Drug Use: No  . Sexual Activity: Not on file   Other Topics Concern  . Not on file   Social History Narrative     No family history of premature CAD in 1st degree relatives.  Prior to Admission medications   Medication Sig Start Date End Date Taking? Authorizing Provider  albuterol (PROAIR HFA) 108 (90 BASE) MCG/ACT inhaler Inhale 2 puffs into the lungs every 6 (six) hours as needed for wheezing or shortness of breath.   Yes Historical Provider, MD  albuterol (PROVENTIL) (2.5 MG/3ML) 0.083% nebulizer solution Take 2.5 mg by nebulization every 6 (six) hours as needed for wheezing or shortness of breath.   Yes Historical Provider, MD  atorvastatin (LIPITOR) 40 MG tablet Take 40 mg by mouth daily.   Yes Historical Provider, MD  furosemide (LASIX) 40 MG tablet Take 1 tablet (40 mg total) by mouth daily. 04/13/15  Yes Orson Eva, MD  HYDROcodone-acetaminophen (NORCO) 5-325 MG tablet Take 1 tablet by mouth every 6 (six) hours as needed for moderate pain. 05/23/15  Yes Erroll Luna, MD  insulin aspart (NOVOLOG) 100 UNIT/ML FlexPen Inject 7 Units into the skin 3 (three) times daily with meals. 04/13/15  Yes Orson Eva, MD    Insulin Detemir (LEVEMIR) 100 UNIT/ML Pen Inject 22 Units into the skin 2 (two) times daily. 04/13/15  Yes Orson Eva, MD  Insulin Pen Needle 32G X 4 MM MISC Use with insulin pens to dispense insulin 04/13/15  Yes Orson Eva, MD  levothyroxine (SYNTHROID, LEVOTHROID) 200 MCG tablet Take 200 mcg by mouth daily before breakfast.   Yes Historical Provider, MD  metoprolol succinate (TOPROL XL) 25 MG 24 hr tablet Take 1 tablet (25 mg total) by mouth daily. 04/13/15  Yes Orson Eva, MD  OVER THE COUNTER MEDICATION Take 1 tablet by mouth at bedtime. Vitamin B6 or Vitamin B12 - OTC   Yes Historical Provider, MD  vitamin C (ASCORBIC ACID) 500 MG tablet Take 500-1,000 mg by mouth at bedtime.   Yes Historical Provider,  MD  zolpidem (AMBIEN) 10 MG tablet Take 10 mg by mouth at bedtime as needed for sleep.  03/21/15  Yes Historical Provider, MD     Review of systems complete and found to be negative unless listed above in HPI     Physical exam Blood pressure 115/75, pulse 84, height 5\' 10"  (1.778 m), weight 239 lb (108.41 kg). General: NAD Neck: No JVD, no thyromegaly or thyroid nodule.  Lungs: Bilateral inspiratory and expiratory wheezes. CV: Nondisplaced PMI. Regular rate and rhythm, normal S1/S2, no S3/S4, no murmur.  Trace pretibial and periankle edema.  No carotid bruit.   Abdomen: Soft, nontender, obese.  Skin: Intact without lesions or rashes.  Neurologic: Alert and oriented x 3.  Psych: Normal affect. Extremities: No clubbing or cyanosis.  HEENT: Normal.   ECG: Most recent ECG reviewed.  Labs:   Lab Results  Component Value Date   WBC 7.5 04/12/2015   HGB 12.9* 04/12/2015   HCT 39.8 04/12/2015   MCV 93.9 04/12/2015   PLT 555* 04/12/2015   No results for input(s): NA, K, CL, CO2, BUN, CREATININE, CALCIUM, PROT, BILITOT, ALKPHOS, ALT, AST, GLUCOSE in the last 168 hours.  Invalid input(s): LABALBU Lab Results  Component Value Date   [id number] 04/11/2015   No results found for:  CHOL No results found for: HDL No results found for: LDLCALC No results found for: TRIG No results found for: CHOLHDL No results found for: LDLDIRECT       Studies: No results found.  ASSESSMENT AND PLAN:  1. Bilateral leg swelling: Upon review of his hospitalization records and the echocardiogram, I do not feel that his leg swelling was related to diastolic heart failure. His BNP was normal and echocardiography demonstrated both normal systolic and diastolic function with only mild LVH. He may have some component of venous insufficiency leading to leg swelling. Can continue Lasix.  2. Wheezing: Likely related to dust and particle exposure at work, although other environmental triggers cannot be ruled out. Would consider referral to an allergist for a thorough evaluation.  I encouraged him to use a respirator at work.  3. Exertional dyspnea and fatigue: Has premature exertional fatigue which has been progressive. He informed me that HbA1C levels have been above 12 in the past. He also has hyperlipidemia. In order to effectively rule out a cardiac etiology, I will obtain a dobutamine Cardiolite stress test given his significant wheezing.  4. Hyperlipidemia: Continue Lipitor.  Dispo: f/u to be determined   Signed: Kate Sable, M.D., F.A.C.C.  06/28/2015, 4:43 PM

## 2015-07-02 ENCOUNTER — Telehealth: Payer: Self-pay | Admitting: Cardiovascular Disease

## 2015-07-02 NOTE — Telephone Encounter (Signed)
Returning call to [facility] can leave detailed message with his wife per patient if he is not home

## 2015-07-03 NOTE — Telephone Encounter (Signed)
Returned pt call, verbally gave instructions on holding metoprolol the day of test 07/05/15 as well as DM medications. Pt voices understanding.

## 2015-07-05 ENCOUNTER — Encounter (HOSPITAL_COMMUNITY): Payer: Self-pay

## 2015-07-05 ENCOUNTER — Encounter (HOSPITAL_COMMUNITY)
Admission: RE | Admit: 2015-07-05 | Discharge: 2015-07-05 | Disposition: A | Discharge status: Home / Self Care | Payer: BLUE CROSS/BLUE SHIELD | Source: Ambulatory Visit | Attending: Cardiovascular Disease | Admitting: Cardiovascular Disease

## 2015-07-05 ENCOUNTER — Inpatient Hospital Stay (HOSPITAL_COMMUNITY): Admission: RE | Admit: 2015-07-05 | Payer: BLUE CROSS/BLUE SHIELD | Source: Ambulatory Visit

## 2015-07-05 DIAGNOSIS — R0609 Other forms of dyspnea: Secondary | ICD-10-CM | POA: Insufficient documentation

## 2015-07-05 DIAGNOSIS — R5383 Other fatigue: Secondary | ICD-10-CM | POA: Diagnosis not present

## 2015-07-05 DIAGNOSIS — R0600 Dyspnea, unspecified: Secondary | ICD-10-CM

## 2015-07-05 LAB — NM MYOCAR MULTI W/SPECT W/WALL MOTION / EF
Exercise duration (min): 11 min
Exercise duration (sec): 12 s
LV dias vol: 96 mL
LV sys vol: 26 mL
Peak HR: 142 {beats}/min
RATE: 0.25
Rest HR: 91 {beats}/min
SDS: 0
SRS: 0
SSS: 0
TID: 0.92

## 2015-07-05 MED ORDER — TECHNETIUM TC 99M SESTAMIBI GENERIC - CARDIOLITE
30.0000 | Freq: Once | INTRAVENOUS | Status: AC | PRN
  Administered 2015-07-05: 30 via INTRAVENOUS

## 2015-07-05 MED ORDER — DOBUTAMINE IN D5W 4-5 MG/ML-% IV SOLN
INTRAVENOUS | Status: AC
  Administered 2015-07-05: 40 ug/kg/min
  Administered 2015-07-05: 20 ug/kg/min
  Administered 2015-07-05: 30 ug/kg/min
  Administered 2015-07-05: 1000000 ug
  Filled 2015-07-05: qty 250

## 2015-07-05 MED ORDER — TECHNETIUM TC 99M SESTAMIBI - CARDIOLITE
10.0000 | Freq: Once | INTRAVENOUS | Status: AC | PRN
  Administered 2015-07-05: 11:00:00 11 via INTRAVENOUS

## 2015-09-03 DIAGNOSIS — G4733 Obstructive sleep apnea (adult) (pediatric): Secondary | ICD-10-CM | POA: Diagnosis not present

## 2015-09-04 ENCOUNTER — Institutional Professional Consult (permissible substitution): Payer: BLUE CROSS/BLUE SHIELD | Admitting: Pulmonary Disease

## 2015-09-05 ENCOUNTER — Encounter: Payer: Self-pay | Admitting: Pulmonary Disease

## 2015-09-05 ENCOUNTER — Ambulatory Visit (INDEPENDENT_AMBULATORY_CARE_PROVIDER_SITE_OTHER): Payer: BLUE CROSS/BLUE SHIELD | Admitting: Pulmonary Disease

## 2015-09-05 VITALS — BP 138/80 | HR 77 | Ht 71.0 in | Wt 251.2 lb

## 2015-09-05 DIAGNOSIS — R0600 Dyspnea, unspecified: Secondary | ICD-10-CM

## 2015-09-05 DIAGNOSIS — G4733 Obstructive sleep apnea (adult) (pediatric): Secondary | ICD-10-CM | POA: Diagnosis not present

## 2015-09-05 NOTE — Patient Instructions (Signed)
We will schedule you for full PFTs with bronchodilator response. Continue using the Symbicort. You need to use it everyday without fail. Please take 2 puffs in the morning and 2 puffs in the evening. Continue using his CPAP and you're advised to lose weight and start exercising.  Return to clinic in 1 month after PFTs

## 2015-09-05 NOTE — Progress Notes (Signed)
Subjective:    Patient ID: Eduardo Long, male    DOB: February 21, 1956, 60 y.o.   MRN: [id number]  HPI Consult for dyspnea.  [name] is a 60 year old with past health history as below. His complaints of dyspnea for the past 1 year. His symptoms are intermittent in nature associated with wheezing. No cough, sputum production. He has increased symptoms when bending over. He does not complain of any allergic symptoms, postnasal drip, GERD. He has history of nasal polyps status post recent resection. He used to take aspirin for pain but was told to avoid this. He works at a certain time [employer] and is exposed to dust and particles at work. However he does not notice any worsening of symptoms during this exposure.  He uses albuterol when necessary which helps with his symptoms. He was started on Dulera this month by his primary care physician Dr. Doyle Askew but he is not taking this on a regular basis. He has being on several steroid tapers for wheezing in the past. There is a report of a normal spirometry that in the past few months. He was admitted in November 2016 for lower extremity cellulitis, respiratory distress. He was told he has diastolic heart failure. However on follow-up evaluation with cardiology he had normal echo and stress test. He was diagnosed with severe sleep apnea in December and is on CPAP for the past 1 month. He has noticed a 30 pound weight gain over the past 6 months.  DATA: Chest x-ray 04/06/15 No acute pulmonary pulmonary disease.  Echocardiogram 04/07/15 Mild LVH. LVEF 65-70 percent normal diastolic function.  Cardiac Stress test 07/05/15-no evidence of ischemia or scar.  Sleep test 05/16/15 Severe sleep apnea, AHI 30, RDI 32, minimum O2 sat 63%  Past Medical History  Diagnosis Date  . Diabetes mellitus without complication ([place])   . Asthma   . Hypothyroidism   . Hypertension   . CHF (congestive heart failure) ([place])     adm 04-12-15  . Sleep apnea     had  sleep test 04-16-15, no results yet  . Hypercholesteremia   . H/O seasonal allergies   . Cellulitis     Current outpatient prescriptions:  .  albuterol (PROAIR HFA) 108 (90 BASE) MCG/ACT inhaler, Inhale 2 puffs into the lungs every 6 (six) hours as needed for wheezing or shortness of breath., Disp: , Rfl:  .  albuterol (PROVENTIL) (2.5 MG/3ML) 0.083% nebulizer solution, Take 2.5 mg by nebulization every 6 (six) hours as needed for wheezing or shortness of breath., Disp: , Rfl:  .  atorvastatin (LIPITOR) 40 MG tablet, Take 40 mg by mouth daily., Disp: , Rfl:  .  furosemide (LASIX) 40 MG tablet, Take 1 tablet (40 mg total) by mouth daily., Disp: 30 tablet, Rfl: 1 .  insulin aspart (NOVOLOG) 100 UNIT/ML FlexPen, Inject 7 Units into the skin 3 (three) times daily with meals. (Patient taking differently: Inject 7 Units into the skin 3 (three) times daily with meals. Using sliding scale), Disp: 15 mL, Rfl: 1 .  Insulin Detemir (LEVEMIR) 100 UNIT/ML Pen, Inject 22 Units into the skin 2 (two) times daily. (Patient taking differently: Inject 45 Units into the skin 2 (two) times daily. ), Disp: 15 mL, Rfl: 1 .  Insulin Pen Needle 32G X 4 MM MISC, Use with insulin pens to dispense insulin, Disp: 100 each, Rfl: 9 .  KLOR-CON M20 20 MEQ tablet, Take 20 mEq by mouth daily., Disp: , Rfl: 1 .  levothyroxine (  SYNTHROID, LEVOTHROID) 200 MCG tablet, Take 200 mcg by mouth daily before breakfast., Disp: , Rfl:  .  metoprolol succinate (TOPROL XL) 25 MG 24 hr tablet, Take 1 tablet (25 mg total) by mouth daily., Disp: 30 tablet, Rfl: 1 .  OVER THE COUNTER MEDICATION, Take 1 tablet by mouth at bedtime. Vitamin B6 or Vitamin B12 - OTC, Disp: , Rfl:  .  vitamin C (ASCORBIC ACID) 500 MG tablet, Take 500-1,000 mg by mouth at bedtime., Disp: , Rfl:  .  zolpidem (AMBIEN) 10 MG tablet, Take 10 mg by mouth at bedtime as needed for sleep. , Disp: , Rfl: 5 .  HYDROcodone-acetaminophen (NORCO) 5-325 MG tablet, Take 1 tablet by  mouth every 6 (six) hours as needed for moderate pain. (Patient not taking: Reported on 09/05/2015), Disp: 30 tablet, Rfl: 0  Social History: He is a never smoker, no alcohol, drug use.  Family History: Mother-stroke, valvular heart disease. Brother-diabetes  Review of Systems Dyspnea with wheezing. No cough, sputum production, no proptosis. No fevers, chills. No nausea, vomiting, diarrhea, constipation. No chest pain, palpitation. All other review of systems negative    Objective:   Physical Exam Blood pressure 138/80, pulse 77, height 5\' 11"  (1.803 m), weight 251 lb 3.2 oz (113.944 kg), SpO2 95 %. Gen: No apparent distress Neuro: No gross focal deficits. HEENT: No JVD, lymphadenopathy, thyromegaly. RS: Clear, No wheeze or crackles CVS: S1-S2 heard, no murmurs rubs gallops. Abdomen: Soft, positive bowel sounds. Musculoskeletal: No edema.     Assessment & Plan:  Dyspnea. This could be secondary to asthma, reactive airway disease. However I think that a large part of the symptoms are secondary to weight gain, obesity, deconditioning. He is recently started CPAP treatment for severe OSA. He is on [facility] but is taking this only intermittently. I have asked him to take this everyday 2 puffs twice a day and get on an exercise and diet regimen. I'll schedule him for a full PFTs with a bronchodilator response.  Plan: - Continue CPAP for OSA - Weight loss, diet and exercise - Full PFTs Start using the dulera 2 puffs twice daily.  Return to clinic in 1-2 months.  Marshell Garfinkel MD [facility] Pager [phone] If no answer or after 3pm call: [phone] 09/05/2015, 11:00 AM

## 2015-11-15 ENCOUNTER — Ambulatory Visit: Payer: BLUE CROSS/BLUE SHIELD | Admitting: Pulmonary Disease

## 2015-12-20 DIAGNOSIS — I10 Essential (primary) hypertension: Secondary | ICD-10-CM | POA: Diagnosis not present

## 2015-12-20 DIAGNOSIS — E1165 Type 2 diabetes mellitus with hyperglycemia: Secondary | ICD-10-CM | POA: Diagnosis not present

## 2015-12-20 DIAGNOSIS — E7800 Pure hypercholesterolemia, unspecified: Secondary | ICD-10-CM | POA: Diagnosis not present

## 2015-12-20 DIAGNOSIS — E039 Hypothyroidism, unspecified: Secondary | ICD-10-CM | POA: Diagnosis not present

## 2015-12-20 DIAGNOSIS — R062 Wheezing: Secondary | ICD-10-CM | POA: Diagnosis not present

## 2015-12-20 DIAGNOSIS — R0600 Dyspnea, unspecified: Secondary | ICD-10-CM | POA: Diagnosis not present

## 2015-12-30 DIAGNOSIS — K047 Periapical abscess without sinus: Secondary | ICD-10-CM | POA: Diagnosis not present

## 2016-01-16 DIAGNOSIS — E039 Hypothyroidism, unspecified: Secondary | ICD-10-CM | POA: Diagnosis not present

## 2016-01-27 ENCOUNTER — Encounter: Payer: Self-pay | Admitting: Pulmonary Disease

## 2016-01-27 ENCOUNTER — Encounter (INDEPENDENT_AMBULATORY_CARE_PROVIDER_SITE_OTHER): Payer: BLUE CROSS/BLUE SHIELD | Admitting: Pulmonary Disease

## 2016-01-27 ENCOUNTER — Ambulatory Visit (INDEPENDENT_AMBULATORY_CARE_PROVIDER_SITE_OTHER): Payer: BLUE CROSS/BLUE SHIELD | Admitting: Pulmonary Disease

## 2016-01-27 VITALS — BP 138/70 | HR 85 | Ht 70.5 in | Wt 248.0 lb

## 2016-01-27 DIAGNOSIS — R0600 Dyspnea, unspecified: Secondary | ICD-10-CM

## 2016-01-27 DIAGNOSIS — J4540 Moderate persistent asthma, uncomplicated: Secondary | ICD-10-CM | POA: Insufficient documentation

## 2016-01-27 LAB — PULMONARY FUNCTION TEST
DL/VA % pred: 92 %
DL/VA: 4.32 ml/min/mmHg/L
DLCO cor % pred: 69 %
DLCO cor: 22.89 ml/min/mmHg
DLCO unc % pred: 65 %
DLCO unc: 21.64 ml/min/mmHg
FEF 25-75 Post: 2.56 L/sec
FEF 25-75 Pre: 0.85 L/sec
FEF2575-%Change-Post: 199 %
FEF2575-%Pred-Post: 87 %
FEF2575-%Pred-Pre: 29 %
FEV1-%Change-Post: 34 %
FEV1-%Pred-Post: 61 %
FEV1-%Pred-Pre: 45 %
FEV1-Post: 2.21 L
FEV1-Pre: 1.64 L
FEV1FVC-%Change-Post: 12 %
FEV1FVC-%Pred-Pre: 86 %
FEV6-%Change-Post: 21 %
FEV6-%Pred-Post: 65 %
FEV6-%Pred-Pre: 54 %
FEV6-Post: 3.01 L
FEV6-Pre: 2.47 L
FEV6FVC-%Change-Post: 1 %
FEV6FVC-%Pred-Post: 103 %
FEV6FVC-%Pred-Pre: 102 %
FVC-%Change-Post: 20 %
FVC-%Pred-Post: 63 %
FVC-%Pred-Pre: 52 %
FVC-Post: 3.05 L
FVC-Pre: 2.53 L
Post FEV1/FVC ratio: 73 %
Post FEV6/FVC ratio: 99 %
Pre FEV1/FVC ratio: 65 %
Pre FEV6/FVC Ratio: 98 %

## 2016-01-27 NOTE — Progress Notes (Signed)
Elim Piel    [id number]    [birth year]  Primary Care Physician:MEYERS, Annie Main, MD  Referring Physician: Orpah Melter, MD [address] [place], [place] [address]  Chief complaint:  Follow up for dyspnea  HPI: Mr. Nim is a 60 year old with past health history as below. His complaints of dyspnea for the past 1 year. His symptoms are intermittent in nature associated with wheezing. No cough, sputum production. He has increased symptoms when bending over. He does not complain of any allergic symptoms, postnasal drip, GERD. He has history of nasal polyps status post recent resection. He used to take aspirin for pain but was told to avoid this. He works at a certain time [employer] and is exposed to dust and particles at work. However he does not notice any worsening of symptoms during this exposure.  He uses albuterol when necessary which helps with his symptoms. He was started on Dulera this month by his primary care physician Dr. Doyle Askew but he is not taking this on a regular basis. He has being on several steroid tapers for wheezing in the past. There is a report of a normal spirometry that in the past few months. He was admitted in November 2016 for lower extremity cellulitis, respiratory distress. He was told he has diastolic heart failure. However on follow-up evaluation with cardiology he had normal echo and stress test. He was diagnosed with severe sleep apnea in December and is on CPAP for the past 1 month. He has noticed a 30 pound weight gain over the past 6 months.   Outpatient Encounter Prescriptions as of 01/27/2016  Medication Sig  . albuterol (PROAIR HFA) 108 (90 BASE) MCG/ACT inhaler Inhale 2 puffs into the lungs every 6 (six) hours as needed for wheezing or shortness of breath.  Marland Kitchen albuterol (PROVENTIL) (2.5 MG/3ML) 0.083% nebulizer solution Take 2.5 mg by nebulization every 6 (six) hours as needed for wheezing or shortness of breath.  Marland Kitchen atorvastatin  (LIPITOR) 40 MG tablet Take 40 mg by mouth daily.  . furosemide (LASIX) 40 MG tablet Take 1 tablet (40 mg total) by mouth daily. (Patient taking differently: Take 80 mg by mouth daily. )  . insulin aspart (NOVOLOG) 100 UNIT/ML FlexPen Inject 7 Units into the skin 3 (three) times daily with meals. (Patient taking differently: Inject 7 Units into the skin 3 (three) times daily with meals. Using sliding scale)  . Insulin Detemir (LEVEMIR) 100 UNIT/ML Pen Inject 22 Units into the skin 2 (two) times daily. (Patient taking differently: Inject 45 Units into the skin 2 (two) times daily. )  . Insulin Pen Needle 32G X 4 MM MISC Use with insulin pens to dispense insulin  . levothyroxine (SYNTHROID, LEVOTHROID) 200 MCG tablet Take 200 mcg by mouth daily before breakfast.  . metoprolol succinate (TOPROL XL) 25 MG 24 hr tablet Take 1 tablet (25 mg total) by mouth daily.  . mometasone-formoterol (DULERA) 100-5 MCG/ACT AERO Inhale 2 puffs into the lungs 2 (two) times daily.  . Multiple Vitamin (MULTIVITAMIN) tablet Take 1 tablet by mouth daily.  Marland Kitchen zolpidem (AMBIEN) 10 MG tablet Take 10 mg by mouth at bedtime as needed for sleep.   . [DISCONTINUED] OVER THE COUNTER MEDICATION Take 1 tablet by mouth at bedtime. Vitamin B6 or Vitamin B12 - OTC  . [DISCONTINUED] vitamin C (ASCORBIC ACID) 500 MG tablet Take 500-1,000 mg by mouth at bedtime.  Marland Kitchen HYDROcodone-acetaminophen (NORCO) 5-325 MG tablet Take 1 tablet  by mouth every 6 (six) hours as needed for moderate pain. (Patient not taking: Reported on 09/05/2015)  . KLOR-CON M20 20 MEQ tablet Take 20 mEq by mouth daily.   No facility-administered encounter medications on file as of 01/27/2016.     Allergies as of 01/27/2016  . (No Known Allergies)    Past Medical History:  Diagnosis Date  . Asthma   . Cellulitis   . CHF (congestive heart failure) ([place])    adm 04-12-15  . Diabetes mellitus without complication ([place])   . H/O seasonal allergies   . Hypercholesteremia    . Hypertension   . Hypothyroidism   . Sleep apnea    had sleep test 04-16-15, no results yet    Past Surgical History:  Procedure Laterality Date  . CARPAL TUNNEL RELEASE Right 03/29/2015   Procedure: RIGHT CARPAL TUNNEL RELEASE;  Surgeon: Roseanne Kaufman, MD;  Location: [place];  Service: Orthopedics;  Laterality: Right;  . COLONOSCOPY    . left Carpal tunnel    . LIPOMA EXCISION N/A 05/23/2015   Procedure: EXCISION LIPOMAS POSTERIOR AND ANTERIOR SCALP;  Surgeon: Erroll Luna, MD;  Location: [place];  Service: General;  Laterality: N/A;  . SINUS ENDO WITH FUSION Bilateral 03/15/2015   Procedure: ENDOSCOPIC SINUS SURGERY WITH FUSION;  Surgeon: Melida Quitter, MD;  Location: [facility];  Service: ENT;  Laterality: Bilateral;    Family History  Problem Relation Age of Onset  . Diabetes Mellitus II Brother   . Deep vein thrombosis Neg Hx   . Valvular heart disease Mother   . Stroke Mother     Social History   Social History  . Marital status: Married    Spouse name: N/A  . Number of children: N/A  . Years of education: N/A   Occupational History  . Not on file.   Social History Main Topics  . Smoking status: Never Smoker  . Smokeless tobacco: Never Used     Comment: Smoked off and on nothing recent  . Alcohol use No  . Drug use: No  . Sexual activity: Not on file   Other Topics Concern  . Not on file   Social History Narrative   Lives with wife.   Has stepchildren x 3.   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     Review of systems: Review of Systems  Constitutional: Negative for fever and chills.  HENT: Negative.   Eyes: Negative for blurred vision.  Respiratory: as per HPI  Cardiovascular: Negative for chest pain and palpitations.  Gastrointestinal: Negative for vomiting, diarrhea, blood per rectum. Genitourinary: Negative for dysuria, urgency, frequency and hematuria.  Musculoskeletal: Negative for  myalgias, back pain and joint pain.  Skin: Negative for itching and rash.  Neurological: Negative for dizziness, tremors, focal weakness, seizures and loss of consciousness.  Endo/Heme/Allergies: Negative for environmental allergies.  Psychiatric/Behavioral: Negative for depression, suicidal ideas and hallucinations.  All other systems reviewed and are negative.   Physical Exam: Blood pressure 138/70, pulse 85, height 5' 10.5" (1.791 m), weight 248 lb (112.5 kg), SpO2 97 %. Gen:      No acute distress HEENT:  EOMI, sclera anicteric Neck:     No masses; no thyromegaly Lungs:    Clear to auscultation bilaterally; normal respiratory effort CV:         Regular rate and rhythm; no murmurs Abd:      + bowel sounds; soft, non-tender; no palpable masses, no distension  Ext:    No edema; adequate peripheral perfusion Skin:      Warm and dry; no rash Neuro: alert and oriented x 3 Psych: normal mood and affect  Data Reviewed: Chest x-ray 04/06/15 No acute pulmonary pulmonary disease.  Echocardiogram 04/07/15 Mild LVH. LVEF 65-70 percent normal diastolic function.  Cardiac Stress test 07/05/15-no evidence of ischemia or scar.  Sleep test 05/16/15 Severe sleep apnea, AHI 30, RDI 32, minimum O2 sat 63%  PFTs 01/27/16 FVC 2.53 [52%) FEV1 1.64 [45%), post bronchodilator 2.21 [61%], +34% F/F 65 TLC 81% RV/TLC 134% DLCO 65%. Moderate-severe obstructive disease with significant bronchial dilator response, and trapping, moderate reduction in diffusion capacity.  Assessment:  #1 Dyspnea, Asthma Dyspnea is likely secondary to asthma, reactive airway disease. However I think that a large part of the symptoms are secondary to weight gain, obesity, deconditioning. He is on [facility] but is taking this only intermittently. I have asked him to take this everyday 2 puffs twice a day and get on an exercise and diet regimen.   #2 OSA He continues on CPAP without any issue.  Plan/Recommendations: -  Continue CPAP for OSA - Weight loss, diet and exercise - Continue dulera 2 puffs twice daily.  Return to clinic in 1-2 months.  Marshell Garfinkel MD [facility] Pager [phone] 01/27/2016, 1:45 PM  CC: Orpah Melter, MD

## 2016-01-27 NOTE — Patient Instructions (Addendum)
Continue using the [facility] inhaler on a regular basis twice daily.  Return to clinic in 6 months.

## 2016-02-20 DIAGNOSIS — G4733 Obstructive sleep apnea (adult) (pediatric): Secondary | ICD-10-CM | POA: Diagnosis not present

## 2016-08-04 DIAGNOSIS — M25572 Pain in left ankle and joints of left foot: Secondary | ICD-10-CM | POA: Diagnosis not present

## 2016-08-04 DIAGNOSIS — S8255XA Nondisplaced fracture of medial malleolus of left tibia, initial encounter for closed fracture: Secondary | ICD-10-CM | POA: Diagnosis not present

## 2016-08-04 DIAGNOSIS — S82892A Other fracture of left lower leg, initial encounter for closed fracture: Secondary | ICD-10-CM | POA: Diagnosis not present

## 2016-08-06 DIAGNOSIS — M25572 Pain in left ankle and joints of left foot: Secondary | ICD-10-CM | POA: Diagnosis not present

## 2016-08-06 DIAGNOSIS — S8255XA Nondisplaced fracture of medial malleolus of left tibia, initial encounter for closed fracture: Secondary | ICD-10-CM | POA: Diagnosis not present

## 2016-08-06 DIAGNOSIS — S8292XA Unspecified fracture of left lower leg, initial encounter for closed fracture: Secondary | ICD-10-CM | POA: Diagnosis not present

## 2016-08-07 DIAGNOSIS — E1140 Type 2 diabetes mellitus with diabetic neuropathy, unspecified: Secondary | ICD-10-CM | POA: Diagnosis not present

## 2016-08-07 DIAGNOSIS — Z79899 Other long term (current) drug therapy: Secondary | ICD-10-CM | POA: Diagnosis not present

## 2016-08-07 DIAGNOSIS — E782 Mixed hyperlipidemia: Secondary | ICD-10-CM | POA: Diagnosis not present

## 2016-08-07 DIAGNOSIS — E039 Hypothyroidism, unspecified: Secondary | ICD-10-CM | POA: Diagnosis not present

## 2016-08-20 DIAGNOSIS — S8255XA Nondisplaced fracture of medial malleolus of left tibia, initial encounter for closed fracture: Secondary | ICD-10-CM | POA: Diagnosis not present

## 2016-08-27 DIAGNOSIS — S8255XA Nondisplaced fracture of medial malleolus of left tibia, initial encounter for closed fracture: Secondary | ICD-10-CM | POA: Diagnosis not present

## 2016-09-10 DIAGNOSIS — S8255XA Nondisplaced fracture of medial malleolus of left tibia, initial encounter for closed fracture: Secondary | ICD-10-CM | POA: Diagnosis not present

## 2016-09-14 DIAGNOSIS — G4733 Obstructive sleep apnea (adult) (pediatric): Secondary | ICD-10-CM | POA: Diagnosis not present

## 2016-09-24 DIAGNOSIS — I10 Essential (primary) hypertension: Secondary | ICD-10-CM | POA: Diagnosis not present

## 2016-09-24 DIAGNOSIS — E1165 Type 2 diabetes mellitus with hyperglycemia: Secondary | ICD-10-CM | POA: Diagnosis not present

## 2016-09-24 DIAGNOSIS — E039 Hypothyroidism, unspecified: Secondary | ICD-10-CM | POA: Diagnosis not present

## 2016-09-24 DIAGNOSIS — E7800 Pure hypercholesterolemia, unspecified: Secondary | ICD-10-CM | POA: Diagnosis not present

## 2016-10-22 DIAGNOSIS — S8255XA Nondisplaced fracture of medial malleolus of left tibia, initial encounter for closed fracture: Secondary | ICD-10-CM | POA: Diagnosis not present

## 2016-12-14 DIAGNOSIS — M25511 Pain in right shoulder: Secondary | ICD-10-CM | POA: Diagnosis not present

## 2017-01-01 DIAGNOSIS — E1165 Type 2 diabetes mellitus with hyperglycemia: Secondary | ICD-10-CM | POA: Diagnosis not present

## 2017-01-01 DIAGNOSIS — E7800 Pure hypercholesterolemia, unspecified: Secondary | ICD-10-CM | POA: Diagnosis not present

## 2017-01-01 DIAGNOSIS — I10 Essential (primary) hypertension: Secondary | ICD-10-CM | POA: Diagnosis not present

## 2017-01-01 DIAGNOSIS — E039 Hypothyroidism, unspecified: Secondary | ICD-10-CM | POA: Diagnosis not present

## 2017-01-14 DIAGNOSIS — R5383 Other fatigue: Secondary | ICD-10-CM | POA: Diagnosis not present

## 2017-01-14 DIAGNOSIS — G4700 Insomnia, unspecified: Secondary | ICD-10-CM | POA: Diagnosis not present

## 2017-01-14 DIAGNOSIS — M2550 Pain in unspecified joint: Secondary | ICD-10-CM | POA: Diagnosis not present

## 2017-02-10 DIAGNOSIS — M25511 Pain in right shoulder: Secondary | ICD-10-CM | POA: Diagnosis not present

## 2017-02-10 DIAGNOSIS — M7540 Impingement syndrome of unspecified shoulder: Secondary | ICD-10-CM | POA: Diagnosis not present

## 2017-02-10 DIAGNOSIS — Z6836 Body mass index (BMI) 36.0-36.9, adult: Secondary | ICD-10-CM | POA: Diagnosis not present

## 2017-03-23 DIAGNOSIS — G4733 Obstructive sleep apnea (adult) (pediatric): Secondary | ICD-10-CM | POA: Diagnosis not present

## 2017-03-24 DIAGNOSIS — M25511 Pain in right shoulder: Secondary | ICD-10-CM | POA: Diagnosis not present

## 2017-03-24 DIAGNOSIS — Z6836 Body mass index (BMI) 36.0-36.9, adult: Secondary | ICD-10-CM | POA: Diagnosis not present

## 2017-03-24 DIAGNOSIS — M7541 Impingement syndrome of right shoulder: Secondary | ICD-10-CM | POA: Diagnosis not present

## 2017-03-30 DIAGNOSIS — M25511 Pain in right shoulder: Secondary | ICD-10-CM | POA: Diagnosis not present

## 2017-03-30 DIAGNOSIS — M7591 Shoulder lesion, unspecified, right shoulder: Secondary | ICD-10-CM | POA: Diagnosis not present

## 2017-03-30 DIAGNOSIS — S43401A Unspecified sprain of right shoulder joint, initial encounter: Secondary | ICD-10-CM | POA: Diagnosis not present

## 2017-04-05 DIAGNOSIS — M25511 Pain in right shoulder: Secondary | ICD-10-CM | POA: Diagnosis not present

## 2017-04-05 DIAGNOSIS — Z6836 Body mass index (BMI) 36.0-36.9, adult: Secondary | ICD-10-CM | POA: Diagnosis not present

## 2017-04-05 DIAGNOSIS — M7541 Impingement syndrome of right shoulder: Secondary | ICD-10-CM | POA: Diagnosis not present

## 2017-09-09 IMAGING — CR DG CHEST 2V
2 series · 2 of 2 positions shown · non-contrast
Comparison: 04/29/2005

CLINICAL DATA: 59-year-old male with a history of swelling

EXAM:
CHEST - 2 VIEW

[chest pa]
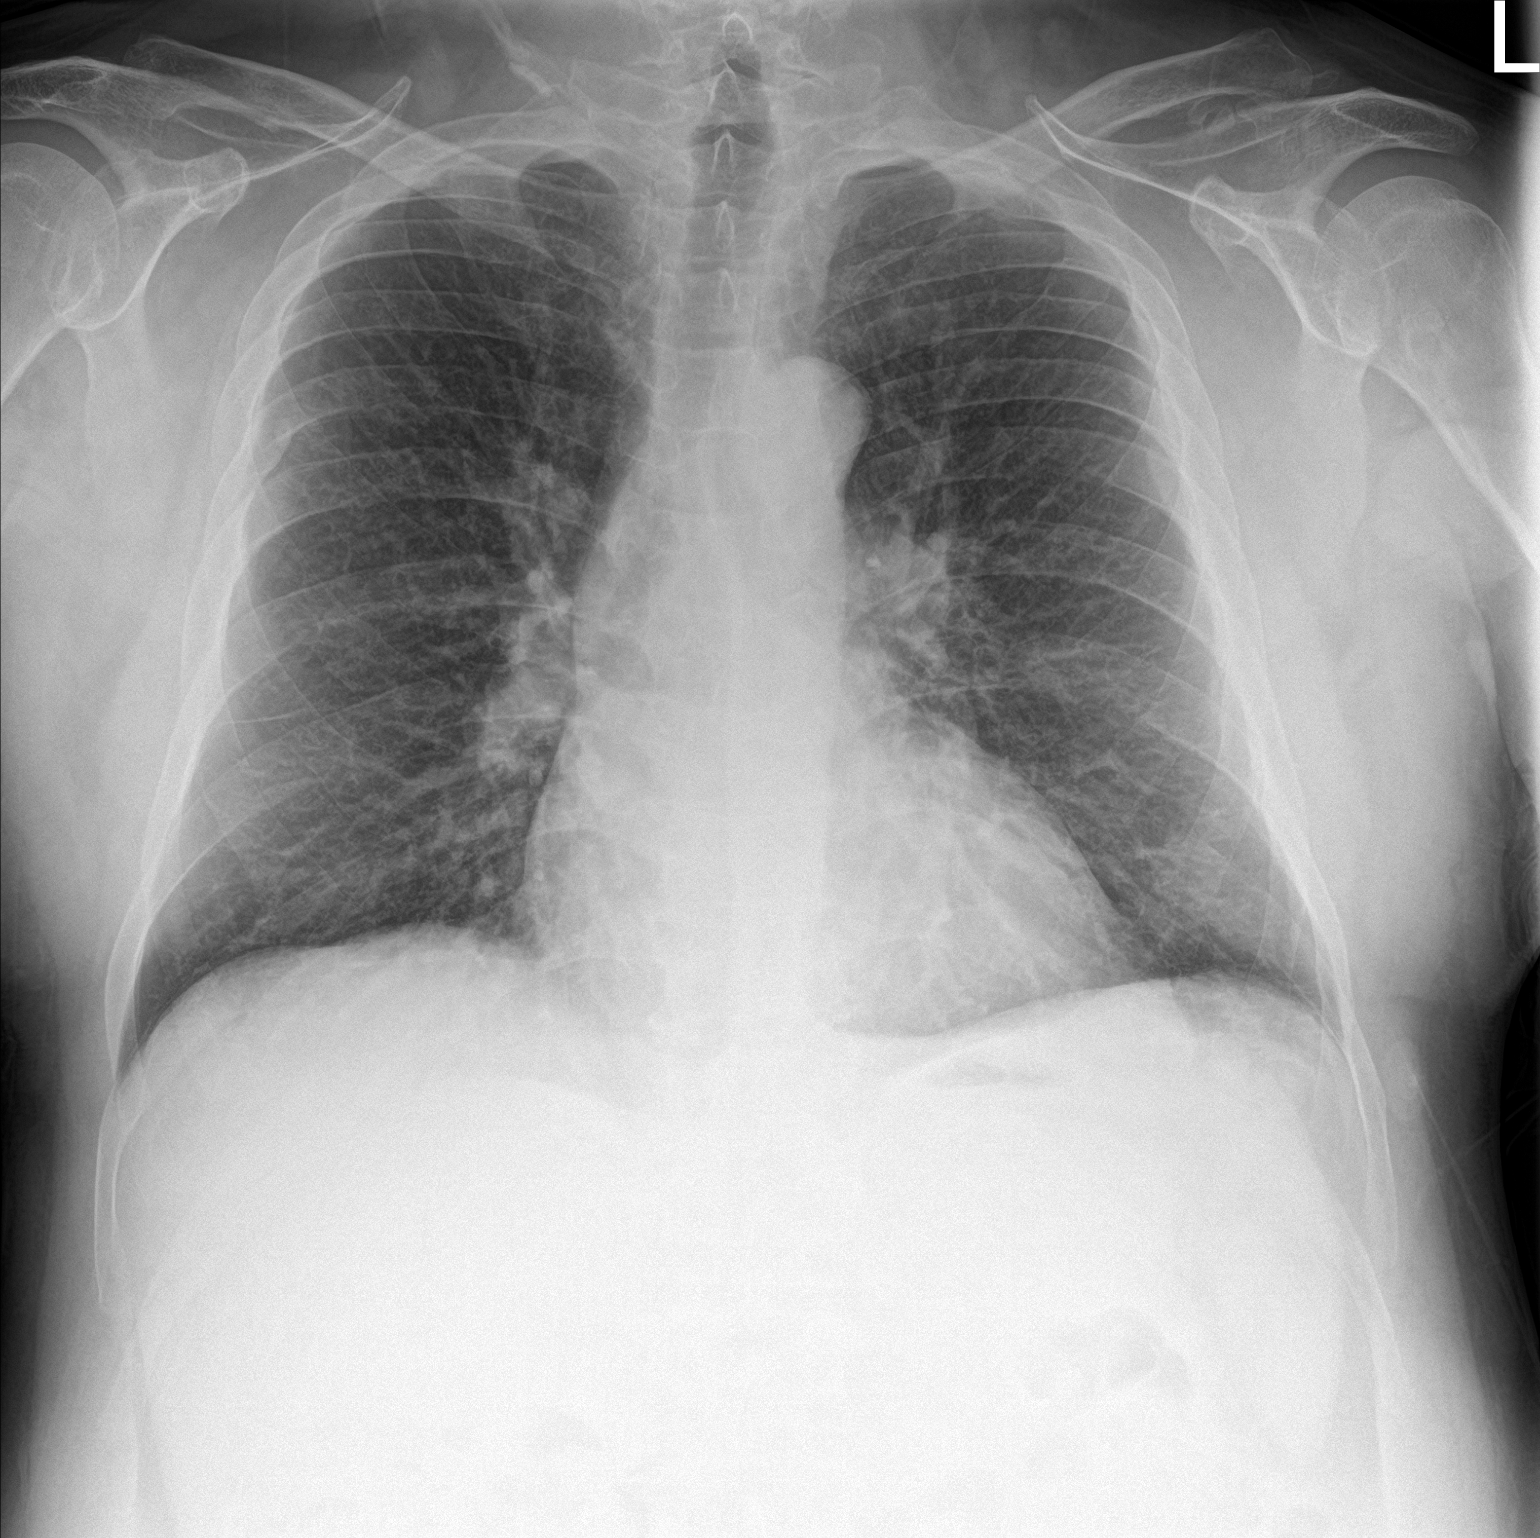

[chest lat]
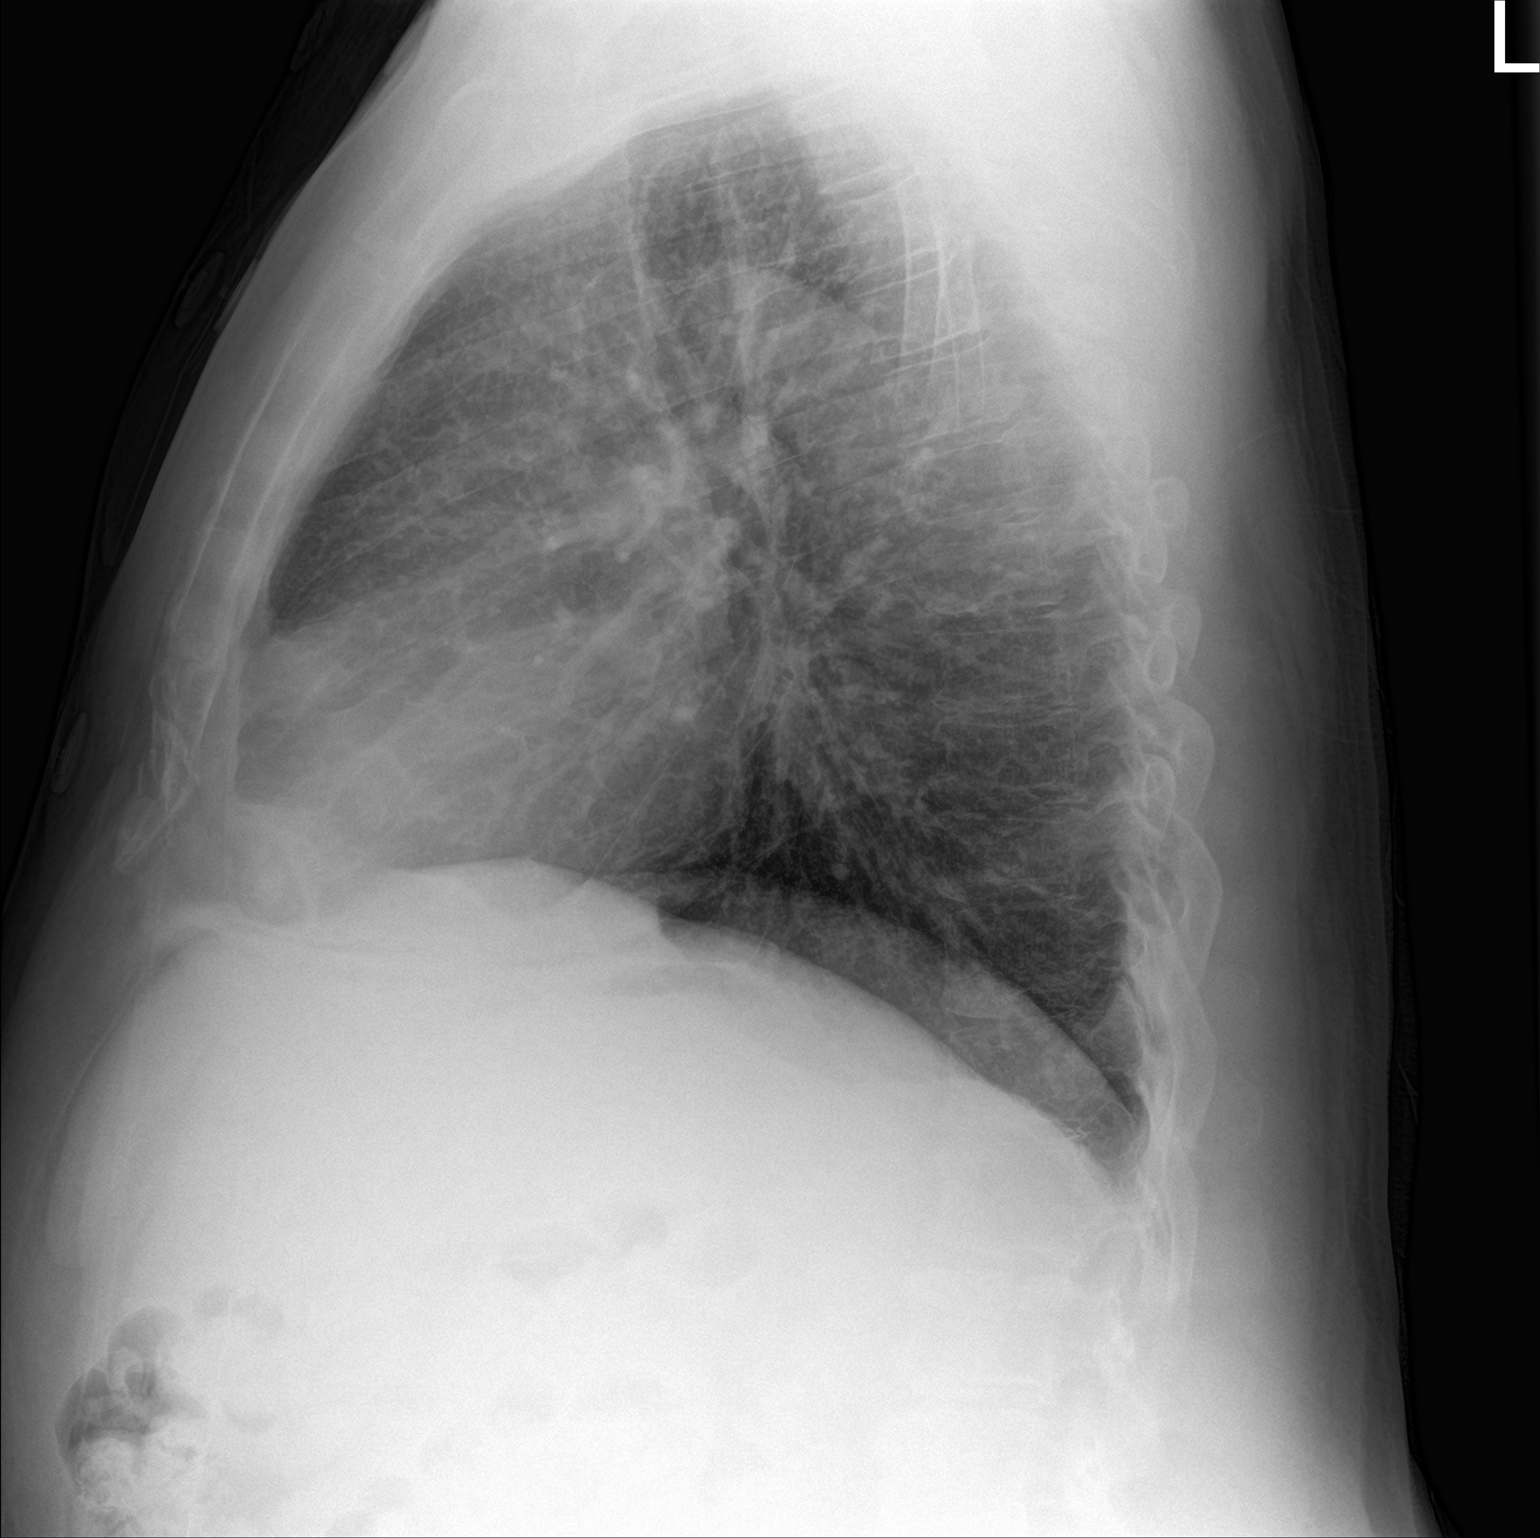

[2 of 2 positions shown; findings below may reference images not displayed]

FINDINGS: Cardiomediastinal silhouette projects within normal limits in size
and contour. No confluent airspace disease, pneumothorax, or pleural
effusion.

No displaced fracture.

Unremarkable appearance of the upper abdomen.
IMPRESSION: No radiographic evidence of acute cardiopulmonary disease.

## 2017-09-19 DIAGNOSIS — R05 Cough: Secondary | ICD-10-CM | POA: Diagnosis not present

## 2017-09-19 DIAGNOSIS — R6889 Other general symptoms and signs: Secondary | ICD-10-CM | POA: Diagnosis not present

## 2017-09-19 DIAGNOSIS — Z6835 Body mass index (BMI) 35.0-35.9, adult: Secondary | ICD-10-CM | POA: Diagnosis not present

## 2017-09-21 DIAGNOSIS — E039 Hypothyroidism, unspecified: Secondary | ICD-10-CM | POA: Diagnosis not present

## 2017-09-21 DIAGNOSIS — E1165 Type 2 diabetes mellitus with hyperglycemia: Secondary | ICD-10-CM | POA: Diagnosis not present

## 2017-09-21 DIAGNOSIS — E7800 Pure hypercholesterolemia, unspecified: Secondary | ICD-10-CM | POA: Diagnosis not present

## 2017-09-21 DIAGNOSIS — I10 Essential (primary) hypertension: Secondary | ICD-10-CM | POA: Diagnosis not present

## 2017-09-23 DIAGNOSIS — Z09 Encounter for follow-up examination after completed treatment for conditions other than malignant neoplasm: Secondary | ICD-10-CM | POA: Diagnosis not present

## 2017-09-23 DIAGNOSIS — R6889 Other general symptoms and signs: Secondary | ICD-10-CM | POA: Diagnosis not present

## 2017-09-23 DIAGNOSIS — E039 Hypothyroidism, unspecified: Secondary | ICD-10-CM | POA: Diagnosis not present

## 2017-09-23 DIAGNOSIS — R0609 Other forms of dyspnea: Secondary | ICD-10-CM | POA: Diagnosis not present

## 2017-10-04 DIAGNOSIS — J45909 Unspecified asthma, uncomplicated: Secondary | ICD-10-CM | POA: Diagnosis not present

## 2017-10-04 DIAGNOSIS — G4733 Obstructive sleep apnea (adult) (pediatric): Secondary | ICD-10-CM | POA: Diagnosis not present

## 2017-10-15 ENCOUNTER — Ambulatory Visit: Payer: BLUE CROSS/BLUE SHIELD | Admitting: Physician Assistant

## 2017-11-24 DIAGNOSIS — E039 Hypothyroidism, unspecified: Secondary | ICD-10-CM | POA: Diagnosis not present

## 2018-01-17 DIAGNOSIS — E7800 Pure hypercholesterolemia, unspecified: Secondary | ICD-10-CM | POA: Diagnosis not present

## 2018-01-17 DIAGNOSIS — I10 Essential (primary) hypertension: Secondary | ICD-10-CM | POA: Diagnosis not present

## 2018-01-17 DIAGNOSIS — E1165 Type 2 diabetes mellitus with hyperglycemia: Secondary | ICD-10-CM | POA: Diagnosis not present

## 2018-01-17 DIAGNOSIS — E039 Hypothyroidism, unspecified: Secondary | ICD-10-CM | POA: Diagnosis not present

## 2018-02-14 DIAGNOSIS — Z6836 Body mass index (BMI) 36.0-36.9, adult: Secondary | ICD-10-CM | POA: Diagnosis not present

## 2018-02-14 DIAGNOSIS — M792 Neuralgia and neuritis, unspecified: Secondary | ICD-10-CM | POA: Diagnosis not present

## 2018-02-18 DIAGNOSIS — M792 Neuralgia and neuritis, unspecified: Secondary | ICD-10-CM | POA: Diagnosis not present

## 2018-02-18 DIAGNOSIS — M50222 Other cervical disc displacement at C5-C6 level: Secondary | ICD-10-CM | POA: Diagnosis not present

## 2018-02-18 DIAGNOSIS — M5412 Radiculopathy, cervical region: Secondary | ICD-10-CM | POA: Diagnosis not present

## 2018-02-18 DIAGNOSIS — M47812 Spondylosis without myelopathy or radiculopathy, cervical region: Secondary | ICD-10-CM | POA: Diagnosis not present

## 2018-02-18 DIAGNOSIS — M5021 Other cervical disc displacement,  high cervical region: Secondary | ICD-10-CM | POA: Diagnosis not present

## 2018-02-18 DIAGNOSIS — M50223 Other cervical disc displacement at C6-C7 level: Secondary | ICD-10-CM | POA: Diagnosis not present

## 2018-02-18 DIAGNOSIS — M4802 Spinal stenosis, cervical region: Secondary | ICD-10-CM | POA: Diagnosis not present

## 2018-02-28 DIAGNOSIS — M5412 Radiculopathy, cervical region: Secondary | ICD-10-CM | POA: Diagnosis not present

## 2018-02-28 DIAGNOSIS — R937 Abnormal findings on diagnostic imaging of other parts of musculoskeletal system: Secondary | ICD-10-CM | POA: Diagnosis not present

## 2018-02-28 DIAGNOSIS — Z6836 Body mass index (BMI) 36.0-36.9, adult: Secondary | ICD-10-CM | POA: Diagnosis not present

## 2018-03-17 DIAGNOSIS — E039 Hypothyroidism, unspecified: Secondary | ICD-10-CM | POA: Diagnosis not present

## 2018-03-24 DIAGNOSIS — E1165 Type 2 diabetes mellitus with hyperglycemia: Secondary | ICD-10-CM | POA: Diagnosis not present

## 2018-03-24 DIAGNOSIS — E7800 Pure hypercholesterolemia, unspecified: Secondary | ICD-10-CM | POA: Diagnosis not present

## 2018-03-24 DIAGNOSIS — E039 Hypothyroidism, unspecified: Secondary | ICD-10-CM | POA: Diagnosis not present

## 2018-03-24 DIAGNOSIS — I10 Essential (primary) hypertension: Secondary | ICD-10-CM | POA: Diagnosis not present

## 2018-04-07 DIAGNOSIS — Z6834 Body mass index (BMI) 34.0-34.9, adult: Secondary | ICD-10-CM | POA: Diagnosis not present

## 2018-04-07 DIAGNOSIS — R937 Abnormal findings on diagnostic imaging of other parts of musculoskeletal system: Secondary | ICD-10-CM | POA: Diagnosis not present

## 2018-04-07 DIAGNOSIS — M5412 Radiculopathy, cervical region: Secondary | ICD-10-CM | POA: Diagnosis not present

## 2018-04-07 DIAGNOSIS — M4712 Other spondylosis with myelopathy, cervical region: Secondary | ICD-10-CM | POA: Diagnosis not present

## 2018-04-08 DIAGNOSIS — G4733 Obstructive sleep apnea (adult) (pediatric): Secondary | ICD-10-CM | POA: Diagnosis not present

## 2018-04-11 DIAGNOSIS — M542 Cervicalgia: Secondary | ICD-10-CM | POA: Diagnosis not present

## 2018-04-11 DIAGNOSIS — M4802 Spinal stenosis, cervical region: Secondary | ICD-10-CM | POA: Diagnosis not present

## 2018-04-11 DIAGNOSIS — Z6835 Body mass index (BMI) 35.0-35.9, adult: Secondary | ICD-10-CM | POA: Diagnosis not present

## 2018-04-11 DIAGNOSIS — G992 Myelopathy in diseases classified elsewhere: Secondary | ICD-10-CM | POA: Diagnosis not present

## 2018-04-12 ENCOUNTER — Other Ambulatory Visit: Payer: Self-pay | Admitting: Neurosurgery

## 2018-04-26 DIAGNOSIS — R05 Cough: Secondary | ICD-10-CM | POA: Diagnosis not present

## 2018-04-26 DIAGNOSIS — J0110 Acute frontal sinusitis, unspecified: Secondary | ICD-10-CM | POA: Diagnosis not present

## 2018-04-26 DIAGNOSIS — G4700 Insomnia, unspecified: Secondary | ICD-10-CM | POA: Diagnosis not present

## 2018-05-02 DIAGNOSIS — E1165 Type 2 diabetes mellitus with hyperglycemia: Secondary | ICD-10-CM | POA: Diagnosis not present

## 2018-05-02 DIAGNOSIS — E039 Hypothyroidism, unspecified: Secondary | ICD-10-CM | POA: Diagnosis not present

## 2018-05-02 DIAGNOSIS — E7800 Pure hypercholesterolemia, unspecified: Secondary | ICD-10-CM | POA: Diagnosis not present

## 2018-05-04 DIAGNOSIS — E7800 Pure hypercholesterolemia, unspecified: Secondary | ICD-10-CM | POA: Diagnosis not present

## 2018-05-04 DIAGNOSIS — E039 Hypothyroidism, unspecified: Secondary | ICD-10-CM | POA: Diagnosis not present

## 2018-05-04 DIAGNOSIS — I10 Essential (primary) hypertension: Secondary | ICD-10-CM | POA: Diagnosis not present

## 2018-05-04 DIAGNOSIS — E1165 Type 2 diabetes mellitus with hyperglycemia: Secondary | ICD-10-CM | POA: Diagnosis not present

## 2018-05-13 ENCOUNTER — Inpatient Hospital Stay (HOSPITAL_COMMUNITY): Admit: 2018-05-13 | Payer: BLUE CROSS/BLUE SHIELD | Admitting: Neurosurgery

## 2018-05-13 ENCOUNTER — Encounter (HOSPITAL_COMMUNITY): Payer: Self-pay

## 2018-05-13 SURGERY — ANTERIOR CERVICAL DECOMPRESSION/DISCECTOMY FUSION 4 LEVELS
Anesthesia: General

## 2018-05-26 DIAGNOSIS — E782 Mixed hyperlipidemia: Secondary | ICD-10-CM | POA: Diagnosis not present

## 2018-05-26 DIAGNOSIS — E1140 Type 2 diabetes mellitus with diabetic neuropathy, unspecified: Secondary | ICD-10-CM | POA: Diagnosis not present

## 2018-05-26 DIAGNOSIS — G4700 Insomnia, unspecified: Secondary | ICD-10-CM | POA: Diagnosis not present

## 2018-05-26 DIAGNOSIS — E039 Hypothyroidism, unspecified: Secondary | ICD-10-CM | POA: Diagnosis not present

## 2018-10-31 ENCOUNTER — Telehealth: Payer: Self-pay

## 2018-10-31 DIAGNOSIS — Z20822 Contact with and (suspected) exposure to covid-19: Secondary | ICD-10-CM

## 2018-10-31 NOTE — Telephone Encounter (Signed)
Covid testing ordered and scheduled per [facility]  [address] Orpah Melter. Office # [phone] Fax#  [phone]

## 2018-10-31 NOTE — Telephone Encounter (Addendum)
Leitha Bleak., LPN at [facility] called and requested testing for covid referred by Synthia Innocent, FNP, I advised I will call the patient to schedule. Office number [phone]; Fax number [phone].  Patient called, left VM to call back for testing, [phone] between the hours 0700-1900 Monday through Friday.

## 2018-11-01 ENCOUNTER — Other Ambulatory Visit: Payer: BLUE CROSS/BLUE SHIELD

## 2018-11-01 DIAGNOSIS — Z20822 Contact with and (suspected) exposure to covid-19: Secondary | ICD-10-CM

## 2018-11-02 LAB — NOVEL CORONAVIRUS, NAA: SARS-CoV-2, NAA: NOT DETECTED

## 2019-04-14 ENCOUNTER — Other Ambulatory Visit: Payer: Self-pay

## 2019-04-14 DIAGNOSIS — Z20822 Contact with and (suspected) exposure to covid-19: Secondary | ICD-10-CM

## 2019-04-16 LAB — NOVEL CORONAVIRUS, NAA: SARS-CoV-2, NAA: NOT DETECTED

## 2019-05-09 ENCOUNTER — Emergency Department (HOSPITAL_COMMUNITY)
Admission: EM | Admit: 2019-05-09 | Discharge: 2019-05-09 | Disposition: A | Discharge status: Home / Self Care | Payer: Medicare Other | Attending: Emergency Medicine | Admitting: Emergency Medicine

## 2019-05-09 ENCOUNTER — Emergency Department (HOSPITAL_COMMUNITY): Payer: Medicare Other

## 2019-05-09 ENCOUNTER — Other Ambulatory Visit: Payer: Self-pay

## 2019-05-09 ENCOUNTER — Encounter (HOSPITAL_COMMUNITY): Payer: Self-pay | Admitting: Emergency Medicine

## 2019-05-09 DIAGNOSIS — Z79899 Other long term (current) drug therapy: Secondary | ICD-10-CM | POA: Diagnosis not present

## 2019-05-09 DIAGNOSIS — Z794 Long term (current) use of insulin: Secondary | ICD-10-CM | POA: Diagnosis not present

## 2019-05-09 DIAGNOSIS — E039 Hypothyroidism, unspecified: Secondary | ICD-10-CM | POA: Diagnosis not present

## 2019-05-09 DIAGNOSIS — Z20828 Contact with and (suspected) exposure to other viral communicable diseases: Secondary | ICD-10-CM | POA: Diagnosis not present

## 2019-05-09 DIAGNOSIS — J441 Chronic obstructive pulmonary disease with (acute) exacerbation: Secondary | ICD-10-CM | POA: Diagnosis not present

## 2019-05-09 DIAGNOSIS — I5032 Chronic diastolic (congestive) heart failure: Secondary | ICD-10-CM | POA: Diagnosis not present

## 2019-05-09 DIAGNOSIS — J45909 Unspecified asthma, uncomplicated: Secondary | ICD-10-CM | POA: Diagnosis not present

## 2019-05-09 DIAGNOSIS — I110 Hypertensive heart disease with heart failure: Secondary | ICD-10-CM | POA: Diagnosis not present

## 2019-05-09 DIAGNOSIS — R0602 Shortness of breath: Secondary | ICD-10-CM | POA: Diagnosis present

## 2019-05-09 LAB — CBC
HCT: 45.9 % (ref 39.0–52.0)
Hemoglobin: 14.9 g/dL (ref 13.0–17.0)
MCH: 31 pg (ref 26.0–34.0)
MCHC: 32.5 g/dL (ref 30.0–36.0)
MCV: 95.4 fL (ref 80.0–100.0)
Platelets: 397 10*3/uL (ref 150–400)
RBC: 4.81 MIL/uL (ref 4.22–5.81)
RDW: 13.1 % (ref 11.5–15.5)
WBC: 8.6 10*3/uL (ref 4.0–10.5)
nRBC: 0 % (ref 0.0–0.2)

## 2019-05-09 LAB — BASIC METABOLIC PANEL
Anion gap: 11 (ref 5–15)
BUN: 15 mg/dL (ref 8–23)
CO2: 28 mmol/L (ref 22–32)
Calcium: 9.1 mg/dL (ref 8.9–10.3)
Chloride: 101 mmol/L (ref 98–111)
Creatinine, Ser: 0.92 mg/dL (ref 0.61–1.24)
GFR calc Af Amer: >60 mL/min (ref >60)
GFR calc non Af Amer: >60 mL/min (ref >60)
Glucose, Bld: 245 mg/dL — ABNORMAL HIGH (ref 70–99)
Potassium: 4.7 mmol/L (ref 3.5–5.1)
Sodium: 140 mmol/L (ref 135–145)

## 2019-05-09 LAB — POC SARS CORONAVIRUS 2 AG -  ED: SARS Coronavirus 2 Ag: NEGATIVE

## 2019-05-09 LAB — BRAIN NATRIURETIC PEPTIDE: B Natriuretic Peptide: 69 pg/mL (ref 0.0–100.0)

## 2019-05-09 MED ORDER — IPRATROPIUM-ALBUTEROL 0.5-2.5 (3) MG/3ML IN SOLN: 3.0000 mL | Freq: Once | RESPIRATORY_TRACT | Status: DC

## 2019-05-09 MED ORDER — ATROVENT HFA 17 MCG/ACT IN AERS: 2.0000 | INHALATION_SPRAY | Freq: Four times a day (QID) | RESPIRATORY_TRACT | 12 refills | Status: DC | PRN

## 2019-05-09 MED ORDER — ALBUTEROL SULFATE HFA 108 (90 BASE) MCG/ACT IN AERS
4.0000 | INHALATION_SPRAY | Freq: Once | RESPIRATORY_TRACT | Status: AC
  Administered 2019-05-09: 4 via RESPIRATORY_TRACT
  Filled 2019-05-09: qty 6.7

## 2019-05-09 MED ORDER — HYDROCODONE-ACETAMINOPHEN 5-325 MG PO TABS: 1.0000 | ORAL_TABLET | Freq: Four times a day (QID) | ORAL | 0 refills | Status: DC | PRN

## 2019-05-09 MED ORDER — IPRATROPIUM-ALBUTEROL 0.5-2.5 (3) MG/3ML IN SOLN
3.0000 mL | Freq: Once | RESPIRATORY_TRACT | Status: AC
  Administered 2019-05-09: 3 mL via RESPIRATORY_TRACT
  Filled 2019-05-09: qty 3

## 2019-05-09 MED ORDER — ALBUTEROL SULFATE (2.5 MG/3ML) 0.083% IN NEBU: 2.5000 mg | INHALATION_SOLUTION | Freq: Once | RESPIRATORY_TRACT | Status: DC

## 2019-05-09 MED ORDER — PREDNISONE 10 MG PO TABS: 20.0000 mg | ORAL_TABLET | Freq: Every day | ORAL | 0 refills | Status: DC

## 2019-05-09 MED ORDER — ALBUTEROL SULFATE (2.5 MG/3ML) 0.083% IN NEBU
2.5000 mg | INHALATION_SOLUTION | Freq: Once | RESPIRATORY_TRACT | Status: AC
  Administered 2019-05-09: 2.5 mg via RESPIRATORY_TRACT
  Filled 2019-05-09: qty 3

## 2019-05-09 MED ORDER — PREDNISONE 50 MG PO TABS
60.0000 mg | ORAL_TABLET | Freq: Once | ORAL | Status: AC
  Administered 2019-05-09: 60 mg via ORAL
  Filled 2019-05-09: qty 1

## 2019-05-09 NOTE — ED Provider Notes (Signed)
[facility] EMERGENCY DEPARTMENT Provider Note   CSN: [id number] Arrival date & time: 05/09/19  1030     History   Chief Complaint Chief Complaint  Patient presents with  . Shortness of Breath    HPI Eduardo Long is a 63 y.o. male.     Patient complains of shortness of breath.  Patient has a history of asthma and has been on steroids a few times with improvement.  The history is provided by the patient. No language interpreter was used.  Shortness of Breath Severity:  Moderate Onset quality:  Sudden Timing:  Constant Progression:  Worsening Chronicity:  Recurrent Context: activity   Relieved by: Albuterol nebs. Worsened by:  Nothing Ineffective treatments:  Inhaler Associated symptoms: no abdominal pain, no chest pain, no cough, no headaches and no rash     Past Medical History:  Diagnosis Date  . Asthma   . Cellulitis   . CHF (congestive heart failure) ([place])    adm 04-12-15  . Diabetes mellitus without complication ([place])   . H/O seasonal allergies   . Hypercholesteremia   . Hypertension   . Hypothyroidism   . Sleep apnea    had sleep test 04-16-15, no results yet    Patient Active Problem List   Diagnosis Date Noted  . Asthma, moderate persistent 01/27/2016  . Type 2 diabetes mellitus with hyperglycemia ([place]) 04/11/2015  . Cellulitis of left leg   . Acute diastolic CHF (congestive heart failure) ([place]) 04/10/2015  . Cellulitis of left lower extremity 04/07/2015  . IDDM (insulin dependent diabetes mellitus) 04/06/2015  . Cellulitis of leg, left 04/06/2015  . Bilateral lower extremity edema 04/06/2015  . Dyspnea 04/06/2015  . Hypothyroidism 04/06/2015    Past Surgical History:  Procedure Laterality Date  . CARPAL TUNNEL RELEASE Right 03/29/2015   Procedure: RIGHT CARPAL TUNNEL RELEASE;  Surgeon: Roseanne Kaufman, MD;  Location: [place];  Service: Orthopedics;  Laterality: Right;  . COLONOSCOPY    . left Carpal tunnel    . LIPOMA  EXCISION N/A 05/23/2015   Procedure: EXCISION LIPOMAS POSTERIOR AND ANTERIOR SCALP;  Surgeon: Erroll Luna, MD;  Location: [place];  Service: General;  Laterality: N/A;  . SINUS ENDO WITH FUSION Bilateral 03/15/2015   Procedure: ENDOSCOPIC SINUS SURGERY WITH FUSION;  Surgeon: Melida Quitter, MD;  Location: [place];  Service: ENT;  Laterality: Bilateral;        Home Medications    Prior to Admission medications   Medication Sig Start Date End Date Taking? Authorizing Provider  albuterol (PROAIR HFA) 108 (90 BASE) MCG/ACT inhaler Inhale 2 puffs into the lungs every 6 (six) hours as needed for wheezing or shortness of breath.    [provider]  albuterol (PROVENTIL) (2.5 MG/3ML) 0.083% nebulizer solution Take 2.5 mg by nebulization every 6 (six) hours as needed for wheezing or shortness of breath.    [provider]  atorvastatin (LIPITOR) 40 MG tablet Take 40 mg by mouth daily.    [provider]  furosemide (LASIX) 40 MG tablet Take 1 tablet (40 mg total) by mouth daily. Patient taking differently: Take 80 mg by mouth daily.  04/13/15   Orson Eva, MD  HYDROcodone-acetaminophen (NORCO) 5-325 MG tablet Take 1 tablet by mouth every 6 (six) hours as needed for moderate pain. Patient not taking: Reported on 09/05/2015 05/23/15   Erroll Luna, MD  insulin aspart (NOVOLOG) 100 UNIT/ML FlexPen Inject 7 Units into the skin 3 (three) times daily  with meals. Patient taking differently: Inject 7 Units into the skin 3 (three) times daily with meals. Using sliding scale 04/13/15   Tat, Shanon Brow, MD  Insulin Detemir (LEVEMIR) 100 UNIT/ML Pen Inject 22 Units into the skin 2 (two) times daily. Patient taking differently: Inject 45 Units into the skin 2 (two) times daily.  04/13/15   Orson Eva, MD  Insulin Pen Needle 32G X 4 MM MISC Use with insulin pens to dispense insulin 04/13/15   Tat, Shanon Brow, MD  ipratropium (ATROVENT HFA) 17 MCG/ACT inhaler  Inhale 2 puffs into the lungs every 6 (six) hours as needed for wheezing. 05/09/19   Milton Ferguson, MD  KLOR-CON M20 20 MEQ tablet Take 20 mEq by mouth daily. 06/17/15   [provider]  levothyroxine (SYNTHROID, LEVOTHROID) 200 MCG tablet Take 200 mcg by mouth daily before breakfast.    [provider]  metoprolol succinate (TOPROL XL) 25 MG 24 hr tablet Take 1 tablet (25 mg total) by mouth daily. 04/13/15   Orson Eva, MD  mometasone-formoterol (DULERA) 100-5 MCG/ACT AERO Inhale 2 puffs into the lungs 2 (two) times daily.    [provider]  Multiple Vitamin (MULTIVITAMIN) tablet Take 1 tablet by mouth daily.    [provider]  predniSONE (DELTASONE) 10 MG tablet Take 2 tablets (20 mg total) by mouth daily. 05/09/19   Milton Ferguson, MD  zolpidem (AMBIEN) 10 MG tablet Take 10 mg by mouth at bedtime as needed for sleep.  03/21/15   [provider]    Family History Family History  Problem Relation Age of Onset  . Diabetes Mellitus II Brother   . Valvular heart disease Mother   . Stroke Mother   . Deep vein thrombosis Neg Hx     Social History Social History   Tobacco Use  . Smoking status: Never Smoker  . Smokeless tobacco: Never Used  . Tobacco comment: Smoked off and on nothing recent  Substance Use Topics  . Alcohol use: No    Alcohol/week: 0.0 standard drinks  . Drug use: No     Allergies   Patient has no known allergies.   Review of Systems Review of Systems  Constitutional: Negative for appetite change and fatigue.  HENT: Negative for congestion, ear discharge and sinus pressure.   Eyes: Negative for discharge.  Respiratory: Positive for shortness of breath. Negative for cough.   Cardiovascular: Negative for chest pain.  Gastrointestinal: Negative for abdominal pain and diarrhea.  Genitourinary: Negative for frequency and hematuria.  Musculoskeletal: Negative for back pain.  Skin: Negative for rash.  Neurological:  Negative for seizures and headaches.  Psychiatric/Behavioral: Negative for hallucinations.     Physical Exam Updated Vital Signs BP (!) 150/92   Pulse 84   Temp 98.7 F (37.1 C) (Oral)   Resp 20   Ht 5\' 10"  (1.778 m)   Wt 124.7 kg   SpO2 94%   BMI 39.46 kg/m   Physical Exam Vitals signs and nursing note reviewed.  Constitutional:      Appearance: He is well-developed.  HENT:     Head: Normocephalic.     Nose: Nose normal.  Eyes:     General: No scleral icterus.    Conjunctiva/sclera: Conjunctivae normal.  Neck:     Musculoskeletal: Neck supple.     Thyroid: No thyromegaly.  Cardiovascular:     Rate and Rhythm: Normal rate and regular rhythm.     Heart sounds: No murmur. No friction rub. No gallop.  Pulmonary:     Breath sounds: No stridor. Wheezing present. No rales.  Chest:     Chest wall: No tenderness.  Abdominal:     General: There is no distension.     Tenderness: There is no abdominal tenderness. There is no rebound.  Musculoskeletal: Normal range of motion.  Lymphadenopathy:     Cervical: No cervical adenopathy.  Skin:    Findings: No erythema or rash.  Neurological:     Mental Status: He is alert and oriented to person, place, and time.     Motor: No abnormal muscle tone.     Coordination: Coordination normal.  Psychiatric:        Behavior: Behavior normal.      ED Treatments / Results  Labs (all labs ordered are listed, but only abnormal results are displayed) Labs Reviewed  BASIC METABOLIC PANEL - Abnormal; Notable for the following components:      Result Value   Glucose, Bld 245 (*)    All other components within normal limits  CBC  BRAIN NATRIURETIC PEPTIDE  POC SARS CORONAVIRUS 2 AG -  ED    EKG None  Radiology Dg Chest 2 View  Result Date: 05/09/2019 CLINICAL DATA:  Acute shortness of breath EXAM: CHEST - 2 VIEW COMPARISON:  11/04/2018 and prior radiographs FINDINGS: Cardiomegaly and mild chronic peribronchial thickening again  noted. There is no evidence of focal airspace disease, pulmonary edema, suspicious pulmonary nodule/mass, pleural effusion, or pneumothorax. No acute bony abnormalities are identified. Remote bilateral rib fractures are again identified. IMPRESSION: No active cardiopulmonary disease. Cardiomegaly and mild chronic peribronchial thickening. Electronically Signed   By: Margarette Canada M.D.   On: 05/09/2019 12:16    Procedures Procedures (including critical care time)  Medications Ordered in ED Medications  predniSONE (DELTASONE) tablet 60 mg (60 mg Oral Given 05/09/19 1630)  albuterol (VENTOLIN HFA) 108 (90 Base) MCG/ACT inhaler 4 puff (4 puffs Inhalation Given 05/09/19 1632)  ipratropium-albuterol (DUONEB) 0.5-2.5 (3) MG/3ML nebulizer solution 3 mL (3 mLs Nebulization Given 05/09/19 1648)  albuterol (PROVENTIL) (2.5 MG/3ML) 0.083% nebulizer solution 2.5 mg (2.5 mg Nebulization Given 05/09/19 1648)     Initial Impression / Assessment and Plan / ED Course  I have reviewed the triage vital signs and the nursing notes.  Pertinent labs & imaging results that were available during my care of the patient were reviewed by me and considered in my medical decision making (see chart for details).       Patient with exacerbation of his COPD.  Patient improved with neb treatments and steroids.  He will follow-up with his PCP Final Clinical Impressions(s) / ED Diagnoses   Final diagnoses:  COPD exacerbation [facility])    ED Discharge Orders         Ordered    predniSONE (DELTASONE) 10 MG tablet  Daily     05/09/19 1750    ipratropium (ATROVENT HFA) 17 MCG/ACT inhaler  Every 6 hours PRN     05/09/19 1750           Milton Ferguson, MD 05/09/19 1803

## 2019-05-09 NOTE — Discharge Instructions (Addendum)
Follow-up with your doctor this week for recheck. 

## 2019-05-09 NOTE — ED Triage Notes (Signed)
Increased shortness of breath, more with activity.  Seen by pcp x 3 weeks ago and rx'd steroids.  Pt states he felt better on the steroids but once he stopped he started getting short of breath again.

## 2019-06-01 ENCOUNTER — Ambulatory Visit: Payer: Medicare Other | Attending: Internal Medicine

## 2019-06-01 ENCOUNTER — Other Ambulatory Visit: Payer: Self-pay

## 2019-06-01 DIAGNOSIS — Z20822 Contact with and (suspected) exposure to covid-19: Secondary | ICD-10-CM

## 2019-06-02 LAB — NOVEL CORONAVIRUS, NAA: SARS-CoV-2, NAA: NOT DETECTED

## 2019-08-24 ENCOUNTER — Other Ambulatory Visit: Payer: Self-pay

## 2019-08-24 ENCOUNTER — Ambulatory Visit: Payer: Medicare Other | Admitting: Cardiovascular Disease

## 2019-08-24 ENCOUNTER — Encounter: Payer: Self-pay | Admitting: Cardiovascular Disease

## 2019-08-24 ENCOUNTER — Encounter: Payer: Self-pay | Admitting: *Deleted

## 2019-08-24 VITALS — BP 118/68 | HR 67 | Ht 69.0 in | Wt 279.0 lb

## 2019-08-24 DIAGNOSIS — Z01812 Encounter for preprocedural laboratory examination: Secondary | ICD-10-CM | POA: Diagnosis not present

## 2019-08-24 DIAGNOSIS — E785 Hyperlipidemia, unspecified: Secondary | ICD-10-CM

## 2019-08-24 DIAGNOSIS — I10 Essential (primary) hypertension: Secondary | ICD-10-CM | POA: Diagnosis not present

## 2019-08-24 DIAGNOSIS — R0600 Dyspnea, unspecified: Secondary | ICD-10-CM | POA: Diagnosis not present

## 2019-08-24 DIAGNOSIS — R0609 Other forms of dyspnea: Secondary | ICD-10-CM

## 2019-08-24 MED ORDER — METOPROLOL TARTRATE 100 MG PO TABS: 100.0000 mg | ORAL_TABLET | Freq: Once | ORAL | 0 refills | Status: DC

## 2019-08-24 NOTE — Patient Instructions (Signed)
Medication Instructions:  Continue all current medications.  Labwork: BMET - order given today.   Testing/Procedures:  Your physician has requested that you have an echocardiogram. Echocardiography is a painless test that uses sound waves to create images of your heart. It provides your doctor with information about the size and shape of your heart and how well your heart's chambers and valves are working. This procedure takes approximately one hour. There are no restrictions for this procedure.  Your physician has requested that you have cardiac CT. Cardiac computed tomography (CT) is a painless test that uses an x-ray machine to take clear, detailed pictures of your heart. For further information please visit [email or web address]. Please follow instruction sheet as given.  Office will contact with results via phone or letter.    Follow-Up: 4 months - phone   Any Other Special Instructions Will Be Listed Below (If Applicable).  If you need a refill on your cardiac medications before your next appointment, please call your pharmacy.

## 2019-08-24 NOTE — Progress Notes (Signed)
CARDIOLOGY CONSULT NOTE  Patient ID: Eduardo Long MRN: [id number] DOB/AGE: [birth year] 64 y.o.  Admit date: (Not on file) Primary Physician: Orpah Melter, MD  Reason for Consultation: Dyspnea on exertion  HPI: Eduardo Long is a 64 y.o. male who is being seen today for the evaluation of dyspnea on exertion at the request of Orpah Melter, MD.   I have evaluated him once before in November 2016.  Echocardiogram performed on 04/07/15 demonstrated normal left ventricular systolic function, LVEF [id number], normal regional wall motion, mild LVH, and reportedly normal diastolic function.  He underwent a normal nuclear stress test on 07/05/2015, LVEF 73%.  Chest x-ray on 05/09/2019 showed cardiomegaly and mild chronic peribronchial thickening.  He has chronic bilateral leg edema due to venous insufficiency.  He told me he is about 100 pounds overweight and has been on prednisone intermittently and attributes his weight gain to this.  He also says he sits around and watches TV and eats ever since the pandemic began.  He gets short of breath when walking short distances and when bending over to tie his shoes.  He would like to begin exercising.  He told me he had been very athletic in his younger days.  He denies exertional chest pain but does have exertional dyspnea.  He denies orthopnea paroxysmal nocturnal dyspnea.  ECG performed today which I personally reviewed demonstrate sinus rhythm with right bundle branch block.  Family history: Mother, maternal aunt, and 2 maternal uncles all had significant coronary artery disease.    No Known Allergies  Current Outpatient Medications  Medication Sig Dispense Refill  . albuterol (PROAIR HFA) 108 (90 BASE) MCG/ACT inhaler Inhale 2 puffs into the lungs every 6 (six) hours as needed for wheezing or shortness of breath.    Marland Kitchen albuterol (PROVENTIL) (2.5 MG/3ML) 0.083% nebulizer solution Take 2.5 mg by nebulization every 6 (six) hours as  needed for wheezing or shortness of breath.    Marland Kitchen atorvastatin (LIPITOR) 40 MG tablet Take 40 mg by mouth daily.    . Budesonide-Formoterol Fumarate (SYMBICORT IN) Inhale into the lungs.    . furosemide (LASIX) 40 MG tablet Take 1 tablet (40 mg total) by mouth daily. (Patient taking differently: Take 80 mg by mouth daily. ) 30 tablet 1  . HYDROcodone-acetaminophen (NORCO/VICODIN) 5-325 MG tablet Take 1 tablet by mouth every 6 (six) hours as needed. 20 tablet 0  . Insulin Detemir (LEVEMIR) 100 UNIT/ML Pen Inject 22 Units into the skin 2 (two) times daily. (Patient taking differently: Inject 45 Units into the skin 2 (two) times daily. ) 15 mL 1  . Insulin NPH Human, Isophane, (HUMULIN N [place]) Inject into the skin.    . Insulin Pen Needle 32G X 4 MM MISC Use with insulin pens to dispense insulin 100 each 9  . ipratropium (ATROVENT HFA) 17 MCG/ACT inhaler Inhale 2 puffs into the lungs every 6 (six) hours as needed for wheezing. 1 Inhaler 12  . KLOR-CON M20 20 MEQ tablet Take 20 mEq by mouth daily.  1  . levothyroxine (SYNTHROID, LEVOTHROID) 200 MCG tablet Take 200 mcg by mouth daily before breakfast.    . metoprolol succinate (TOPROL XL) 25 MG 24 hr tablet Take 1 tablet (25 mg total) by mouth daily. 30 tablet 1  . Multiple Vitamin (MULTIVITAMIN) tablet Take 1 tablet by mouth daily.    Marland Kitchen zolpidem (AMBIEN) 10 MG tablet Take 10 mg by mouth at bedtime as needed for sleep.   5  No current facility-administered medications for this visit.    Past Medical History:  Diagnosis Date  . Asthma   . Cellulitis   . CHF (congestive heart failure) ([place])    adm 04-12-15  . Diabetes mellitus without complication ([place])   . H/O seasonal allergies   . Hypercholesteremia   . Hypertension   . Hypothyroidism   . Sleep apnea    had sleep test 04-16-15, no results yet    Past Surgical History:  Procedure Laterality Date  . CARPAL TUNNEL RELEASE Right 03/29/2015   Procedure: RIGHT CARPAL TUNNEL RELEASE;  Surgeon:  Roseanne Kaufman, MD;  Location: [place];  Service: Orthopedics;  Laterality: Right;  . COLONOSCOPY    . left Carpal tunnel    . LIPOMA EXCISION N/A 05/23/2015   Procedure: EXCISION LIPOMAS POSTERIOR AND ANTERIOR SCALP;  Surgeon: Erroll Luna, MD;  Location: [place];  Service: General;  Laterality: N/A;  . SINUS ENDO WITH FUSION Bilateral 03/15/2015   Procedure: ENDOSCOPIC SINUS SURGERY WITH FUSION;  Surgeon: Melida Quitter, MD;  Location: [place];  Service: ENT;  Laterality: Bilateral;    Social History   Socioeconomic History  . Marital status: Married    Spouse name: Not on file  . Number of children: Not on file  . Years of education: Not on file  . Highest education level: Not on file  Occupational History  . Not on file  Tobacco Use  . Smoking status: Never Smoker  . Smokeless tobacco: Never Used  . Tobacco comment: Smoked off and on nothing recent  Substance and Sexual Activity  . Alcohol use: No    Alcohol/week: 0.0 standard drinks  . Drug use: No  . Sexual activity: Not on file  Other Topics Concern  . Not on file  Social History Narrative   Lives with wife.   Has stepchildren x 3.   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   Social Determinants of Health   Financial Resource Strain:   . Difficulty of Paying Living Expenses:   Food Insecurity:   . Worried About Charity fundraiser in the Last Year:   . Arboriculturist in the Last Year:   Transportation Needs:   . Film/video editor (Medical):   Marland Kitchen Lack of Transportation (Non-Medical):   Physical Activity:   . Days of Exercise per Week:   . Minutes of Exercise per Session:   Stress:   . Feeling of Stress :   Social Connections:   . Frequency of Communication with Friends and Family:   . Frequency of Social Gatherings with Friends and Family:   . Attends Religious Services:   . Active Member of Clubs or Organizations:   . Attends Archivist  Meetings:   Marland Kitchen Marital Status:   Intimate Partner Violence:   . Fear of Current or Ex-Partner:   . Emotionally Abused:   Marland Kitchen Physically Abused:   . Sexually Abused:       Current Meds  Medication Sig  . albuterol (PROAIR HFA) 108 (90 BASE) MCG/ACT inhaler Inhale 2 puffs into the lungs every 6 (six) hours as needed for wheezing or shortness of breath.  Marland Kitchen albuterol (PROVENTIL) (2.5 MG/3ML) 0.083% nebulizer solution Take 2.5 mg by nebulization every 6 (six) hours as needed for wheezing or shortness of breath.  Marland Kitchen atorvastatin (LIPITOR) 40 MG tablet Take 40 mg by mouth daily.  . Budesonide-Formoterol Fumarate (SYMBICORT IN) Inhale into the  lungs.  . furosemide (LASIX) 40 MG tablet Take 1 tablet (40 mg total) by mouth daily. (Patient taking differently: Take 80 mg by mouth daily. )  . HYDROcodone-acetaminophen (NORCO/VICODIN) 5-325 MG tablet Take 1 tablet by mouth every 6 (six) hours as needed.  . Insulin Detemir (LEVEMIR) 100 UNIT/ML Pen Inject 22 Units into the skin 2 (two) times daily. (Patient taking differently: Inject 45 Units into the skin 2 (two) times daily. )  . Insulin NPH Human, Isophane, (HUMULIN N [place]) Inject into the skin.  . Insulin Pen Needle 32G X 4 MM MISC Use with insulin pens to dispense insulin  . ipratropium (ATROVENT HFA) 17 MCG/ACT inhaler Inhale 2 puffs into the lungs every 6 (six) hours as needed for wheezing.  Marland Kitchen KLOR-CON M20 20 MEQ tablet Take 20 mEq by mouth daily.  Marland Kitchen levothyroxine (SYNTHROID, LEVOTHROID) 200 MCG tablet Take 200 mcg by mouth daily before breakfast.  . metoprolol succinate (TOPROL XL) 25 MG 24 hr tablet Take 1 tablet (25 mg total) by mouth daily.  . Multiple Vitamin (MULTIVITAMIN) tablet Take 1 tablet by mouth daily.  Marland Kitchen zolpidem (AMBIEN) 10 MG tablet Take 10 mg by mouth at bedtime as needed for sleep.   . [DISCONTINUED] insulin aspart (NOVOLOG) 100 UNIT/ML FlexPen Inject 7 Units into the skin 3 (three) times daily with meals. (Patient taking differently:  Inject 7 Units into the skin 3 (three) times daily with meals. Using sliding scale)  . [DISCONTINUED] mometasone-formoterol (DULERA) 100-5 MCG/ACT AERO Inhale 2 puffs into the lungs 2 (two) times daily.      Review of systems complete and found to be negative unless listed above in HPI    Physical exam Blood pressure 118/68, pulse 67, height 5\' 9"  (1.753 m), weight 279 lb (126.6 kg), SpO2 98 %. General: Morbidly obese male in NAD Neck: No JVD, no thyromegaly or thyroid nodule.  Lungs: Clear to auscultation bilaterally with normal respiratory effort. CV: Nondisplaced PMI. Regular rate and rhythm, normal S1/S2, no S3/S4, no murmur.  1+ pitting bilateral lower extremity edema with bilateral erythema/venous insufficiency.  No carotid bruit.    Abdomen: Firm, obese.  Skin: Intact without lesions or rashes.  Neurologic: Alert and oriented x 3.  Psych: Normal affect. Extremities: No clubbing or cyanosis.  HEENT: Normal.   ECG: Most recent ECG reviewed.   Labs: Lab Results  Component Value Date/Time   K 4.7 05/09/2019 12:16 PM   BUN 15 05/09/2019 12:16 PM   CREATININE 0.92 05/09/2019 12:16 PM   ALT 60 04/06/2015 04:08 PM   TSH 1.039 04/06/2015 07:58 PM   HGB 14.9 05/09/2019 12:16 PM     Lipids: No results found for: LDLCALC, LDLDIRECT, CHOL, TRIG, HDL      ASSESSMENT AND PLAN:   1.  Dyspnea on exertion: He underwent a normal nuclear stress test in February 2017.  Cardiovascular is factors include insulin-dependent diabetes mellitus, hypertension, and hyperlipidemia.  He is morbidly obese and needs significant weight loss as well which is also contributing to his symptoms.  I will proceed with coronary CT angiography to evaluate for obstructive coronary artery disease. I will also obtain an echocardiogram to evaluate cardiac structure and function.  2.  Hyperlipidemia: Continue atorvastatin.  3.  Hypertension: BP is normal.  No changes to therapy.    Disposition: Follow up  in 4 months virtual visit Signed: Kate Sable, M.D., F.A.C.C.  08/24/2019, 1:17 PM

## 2019-09-12 ENCOUNTER — Other Ambulatory Visit: Payer: Self-pay

## 2019-09-12 ENCOUNTER — Other Ambulatory Visit (HOSPITAL_COMMUNITY)
Admission: RE | Admit: 2019-09-12 | Discharge: 2019-09-12 | Disposition: A | Discharge status: Home / Self Care | Payer: Medicare Other | Source: Ambulatory Visit | Attending: Cardiovascular Disease | Admitting: Cardiovascular Disease

## 2019-09-12 ENCOUNTER — Encounter (HOSPITAL_COMMUNITY): Payer: Self-pay

## 2019-09-12 ENCOUNTER — Telehealth (HOSPITAL_COMMUNITY): Payer: Self-pay | Admitting: Emergency Medicine

## 2019-09-12 DIAGNOSIS — R0600 Dyspnea, unspecified: Secondary | ICD-10-CM | POA: Insufficient documentation

## 2019-09-12 DIAGNOSIS — Z01812 Encounter for preprocedural laboratory examination: Secondary | ICD-10-CM | POA: Diagnosis present

## 2019-09-12 LAB — BASIC METABOLIC PANEL
Anion gap: 10 (ref 5–15)
BUN: 23 mg/dL (ref 8–23)
CO2: 28 mmol/L (ref 22–32)
Calcium: 9 mg/dL (ref 8.9–10.3)
Chloride: 103 mmol/L (ref 98–111)
Creatinine, Ser: 1.19 mg/dL (ref 0.61–1.24)
GFR calc Af Amer: >60 mL/min (ref >60)
GFR calc non Af Amer: >60 mL/min (ref >60)
Glucose, Bld: 128 mg/dL — ABNORMAL HIGH (ref 70–99)
Potassium: 4.1 mmol/L (ref 3.5–5.1)
Sodium: 141 mmol/L (ref 135–145)

## 2019-09-12 NOTE — Telephone Encounter (Signed)
Reaching out to patient to offer assistance regarding upcoming cardiac imaging study; pt verbalizes understanding of appt date/time, parking situation and where to check in, pre-test NPO status and medications ordered, and verified current allergies; name and call back number provided for further questions should they arise Marchia Bond RN Navigator Cardiac Imaging [facility] [phone] office [phone] cell  Pt reports forgot to pick up metoprolol tartrate 100mg  , will double up on TOPROL XL instead  Seleni Meller

## 2019-09-13 ENCOUNTER — Ambulatory Visit (HOSPITAL_COMMUNITY): Payer: Medicare Other

## 2019-09-14 ENCOUNTER — Telehealth: Payer: Self-pay

## 2019-09-14 NOTE — Telephone Encounter (Signed)
New message    Pt c/o medication issue:  1. Name of Medication:  metoprolol tartrate (LOPRESSOR) 100 MG tablet(Expired) Take 1 tablet (100 mg total) by mouth once for 1 dose.     2. How are you currently taking this medication (dosage and times per day)? 1 tablet   3. Are you having a reaction (difficulty breathing--STAT)? no  4. What is your medication issue? Patient was scheduled for cardiac ct but the machine is down and we had to reschedule , but he had already took 2 of the metoprolol and he said that he wanted to let Dr Bronson Ing know that taking 2 of them made his heart stop racing all the time and he felt better than he has in a long time. Please call and speak with patient regarding this.

## 2019-09-14 NOTE — Telephone Encounter (Signed)
Called pt. No answer, left message for pt to return call.  

## 2019-09-15 ENCOUNTER — Ambulatory Visit (HOSPITAL_COMMUNITY): Payer: Medicare Other

## 2019-09-18 NOTE — Telephone Encounter (Signed)
Will send to Dr. Lynnell Jude as an Eduardo Long.

## 2019-09-18 NOTE — Telephone Encounter (Signed)
I will keep that in mind.

## 2019-09-20 ENCOUNTER — Ambulatory Visit (INDEPENDENT_AMBULATORY_CARE_PROVIDER_SITE_OTHER): Payer: Medicare Other

## 2019-09-20 ENCOUNTER — Telehealth: Payer: Self-pay | Admitting: *Deleted

## 2019-09-20 ENCOUNTER — Other Ambulatory Visit: Payer: Self-pay

## 2019-09-20 DIAGNOSIS — R0600 Dyspnea, unspecified: Secondary | ICD-10-CM

## 2019-09-20 DIAGNOSIS — R0609 Other forms of dyspnea: Secondary | ICD-10-CM

## 2019-09-20 NOTE — Telephone Encounter (Signed)
-----   Message from Herminio Commons, MD sent at 09/20/2019  1:22 PM EDT ----- Normal cardiac function

## 2019-09-20 NOTE — Telephone Encounter (Signed)
Laurine Blazer, [place]  [phone] PM EDT    Wife (Dawn) notified. Copy to pcp.

## 2019-09-25 ENCOUNTER — Telehealth: Payer: Self-pay | Admitting: Cardiovascular Disease

## 2019-09-25 NOTE — Telephone Encounter (Signed)
Patient called in regards to his upcoming CT 09/29/2019. He has questions about his instructions.  Please call patient around 400pm today (if possible)

## 2019-09-26 NOTE — Telephone Encounter (Signed)
Cardiac CT instructions read to patient and printed off for pick up.

## 2019-09-28 ENCOUNTER — Telehealth (HOSPITAL_COMMUNITY): Payer: Self-pay | Admitting: Emergency Medicine

## 2019-09-28 NOTE — Telephone Encounter (Signed)
Reaching out to patient to offer assistance regarding upcoming cardiac imaging study; pt verbalizes understanding of appt date/time, parking situation and where to check in, pre-test NPO status and medications ordered, and verified current allergies; name and call back number provided for further questions should they arise Yichen Gilardi RN Navigator Cardiac Imaging [facility] [phone] office [phone] cell 

## 2019-09-29 ENCOUNTER — Ambulatory Visit (HOSPITAL_COMMUNITY): Payer: Medicare Other

## 2019-10-06 ENCOUNTER — Telehealth (HOSPITAL_COMMUNITY): Payer: Self-pay | Admitting: *Deleted

## 2019-10-06 ENCOUNTER — Other Ambulatory Visit: Payer: Self-pay | Admitting: Cardiovascular Disease

## 2019-10-06 NOTE — Telephone Encounter (Signed)
Reaching out to patient to offer assistance regarding upcoming cardiac imaging study; pt verbalizes understanding of appt date/time, parking situation and where to check in, pre-test NPO status and medications ordered, and verified current allergies; name and call back number provided for further questions should they arise Ashdon Gillson Tai RN Navigator Cardiac Imaging [facility] [phone] office [phone] cell 

## 2019-10-09 ENCOUNTER — Ambulatory Visit (HOSPITAL_COMMUNITY)
Admission: RE | Admit: 2019-10-09 | Discharge: 2019-10-09 | Disposition: A | Discharge status: Home / Self Care | Payer: Medicare Other | Source: Ambulatory Visit | Attending: Cardiovascular Disease | Admitting: Cardiovascular Disease

## 2019-10-09 ENCOUNTER — Other Ambulatory Visit: Payer: Self-pay

## 2019-10-09 DIAGNOSIS — I700 Atherosclerosis of aorta: Secondary | ICD-10-CM

## 2019-10-09 DIAGNOSIS — I77810 Thoracic aortic ectasia: Secondary | ICD-10-CM | POA: Insufficient documentation

## 2019-10-09 DIAGNOSIS — I2510 Atherosclerotic heart disease of native coronary artery without angina pectoris: Secondary | ICD-10-CM | POA: Insufficient documentation

## 2019-10-09 DIAGNOSIS — R0609 Other forms of dyspnea: Secondary | ICD-10-CM | POA: Diagnosis present

## 2019-10-09 DIAGNOSIS — R0600 Dyspnea, unspecified: Secondary | ICD-10-CM

## 2019-10-09 LAB — GLUCOSE, CAPILLARY: Glucose-Capillary: 90 mg/dL (ref 70–99)

## 2019-10-09 MED ORDER — IOHEXOL 350 MG/ML SOLN
80.0000 mL | Freq: Once | INTRAVENOUS | Status: AC | PRN
  Administered 2019-10-09: 11:00:00 80 mL via INTRAVENOUS

## 2019-10-09 MED ORDER — NITROGLYCERIN 0.4 MG SL SUBL
0.8000 mg | SUBLINGUAL_TABLET | Freq: Once | SUBLINGUAL | Status: AC
  Administered 2019-10-09: 11:00:00 0.8 mg via SUBLINGUAL

## 2019-10-09 MED ORDER — NITROGLYCERIN 0.4 MG SL SUBL
SUBLINGUAL_TABLET | SUBLINGUAL | Status: AC
  Filled 2019-10-09: qty 2

## 2019-10-09 MED ORDER — METOPROLOL TARTRATE 5 MG/5ML IV SOLN: 5.0000 mg | INTRAVENOUS | Status: DC | PRN

## 2019-10-10 ENCOUNTER — Ambulatory Visit (HOSPITAL_COMMUNITY): Payer: Medicare Other

## 2019-10-11 ENCOUNTER — Telehealth: Payer: Self-pay | Admitting: Cardiovascular Disease

## 2019-10-11 MED ORDER — METOPROLOL SUCCINATE ER 50 MG PO TB24
50.0000 mg | ORAL_TABLET | Freq: Every day | ORAL | 1 refills | Status: AC
Start: 2019-10-11 — End: ?

## 2019-10-11 NOTE — Telephone Encounter (Signed)
He was taking Toprol-XL 25 mg daily when I saw him in the office, which is a once-a-day medication. Lopressor is a twice daily medication. He could increase Toprol XL to 50 mg daily. Or if that has been switched to Lopressor, he could take 25 mg BID.

## 2019-10-11 NOTE — Telephone Encounter (Signed)
Lopressor was documented in error pt has been taking Toprol XL 50 mg daily - updated rx and sent to pharmacy - pt aware

## 2019-10-11 NOTE — Telephone Encounter (Signed)
Eduardo Commons, MD  No significant noncardiac findings  Pt aware - says he has been taking Lopressor 50 mg daily for the last 3 weeks says this helps with symptoms and BP - wanted to have dose increased sent to pharmacy - BP has been 125/73 HR 74 - will forward to provider if ok to cahnge rx Lopressor 50 mg daily

## 2019-10-11 NOTE — Telephone Encounter (Signed)
Patient called requesting results of recent CT CORONARY

## 2019-11-15 ENCOUNTER — Other Ambulatory Visit: Payer: Self-pay

## 2019-11-15 ENCOUNTER — Ambulatory Visit: Payer: Medicare Other | Admitting: Critical Care Medicine

## 2019-11-15 ENCOUNTER — Encounter: Payer: Self-pay | Admitting: Critical Care Medicine

## 2019-11-15 VITALS — BP 132/84 | HR 80 | Temp 97.3°F | Ht 70.0 in | Wt 284.8 lb

## 2019-11-15 DIAGNOSIS — J4540 Moderate persistent asthma, uncomplicated: Secondary | ICD-10-CM | POA: Diagnosis not present

## 2019-11-15 DIAGNOSIS — R0600 Dyspnea, unspecified: Secondary | ICD-10-CM

## 2019-11-15 DIAGNOSIS — J309 Allergic rhinitis, unspecified: Secondary | ICD-10-CM

## 2019-11-15 DIAGNOSIS — R0609 Other forms of dyspnea: Secondary | ICD-10-CM

## 2019-11-15 MED ORDER — MONTELUKAST SODIUM 10 MG PO TABS: 10.0000 mg | ORAL_TABLET | Freq: Every day | ORAL | 11 refills | Status: AC

## 2019-11-15 NOTE — Progress Notes (Signed)
Synopsis: Referred in June 2021 for SOB by Orpah Melter, MD.  Subjective:   PATIENT ID: Eduardo Long GENDER: male DOB: 04-26-1956, MRN: [id number]  Chief Complaint  Patient presents with  . Consult    abn CT, SOB    Mr. Shanks is a 64 y/o gentleman with a history of nasal polyps, allergies, and asthma diagnosed in 2017 who presents for evaluation of SOB.  He was evaluated in 2017 by Dr. Vaughan Browner.  He has had sinus surgery for nasal polyps, but has not followed up recently with ENT.  He continues to use Symbicort twice daily, but has stopped taking Singulair due to perceived ineffectiveness.  He notices that his nebulizer improves his symptoms the most; he noted that his wife's Ventolin improved his symptoms more than proair.  He has occasional cough and wheezing, but most notably is shortness of breath.  He has some nighttime symptoms.  He thinks that some of his symptoms are related to obesity.  He has progressively gained weight over time.  He has been less and less active since retirement.  He cannot golf anymore due to his severe shortness of breath.  He is barely able to walk around his house or to the mailbox.  For the past week his allergy symptoms have been worse than normal with a scratchy throat, ear fullness, rhinorrhea, sneezing.  He tried Mucinex without much benefit in symptoms.  He felt like stopping Flonase helped.  He has not been on antihistamines.  He has taken prednisone frequently in the past he felt a significant symptom benefit with more energy and improved breathing.  He has never smoked or vaped.  Due to shortness of breath he was evaluated by cardiology.  Low risk coronary CTA.  His cardiologist felt that he did not have a cardiac etiology of shortness of breath.  He continues to use his CPAP nightly for OSA.  He has been thinking about losing weight with meal replacements.  He thinks taking prednisone frequently and his wife's cooking have contributed to his weight  gain.       Past Medical History:  Diagnosis Date  . Asthma   . Cellulitis   . CHF (congestive heart failure) ([place])    adm 04-12-15  . Diabetes mellitus without complication ([place])   . H/O seasonal allergies   . Hypercholesteremia   . Hypertension   . Hypothyroidism   . Sleep apnea    had sleep test 04-16-15, no results yet     Family History  Problem Relation Age of Onset  . Diabetes Mellitus II Brother   . Valvular heart disease Mother   . Stroke Mother   . Colon cancer Father   . Heart attack Maternal Aunt   . Heart disease Maternal Uncle   . Deep vein thrombosis Neg Hx      Past Surgical History:  Procedure Laterality Date  . CARPAL TUNNEL RELEASE Right 03/29/2015   Procedure: RIGHT CARPAL TUNNEL RELEASE;  Surgeon: Roseanne Kaufman, MD;  Location: [facility];  Service: Orthopedics;  Laterality: Right;  . COLONOSCOPY    . left Carpal tunnel    . LIPOMA EXCISION N/A 05/23/2015   Procedure: EXCISION LIPOMAS POSTERIOR AND ANTERIOR SCALP;  Surgeon: Erroll Luna, MD;  Location: [facility];  Service: General;  Laterality: N/A;  . SINUS ENDO WITH FUSION Bilateral 03/15/2015   Procedure: ENDOSCOPIC SINUS SURGERY WITH FUSION;  Surgeon: Melida Quitter, MD;  Location: [facility];  Service:  ENT;  Laterality: Bilateral;    Social History   Socioeconomic History  . Marital status: Married    Spouse name: Not on file  . Number of children: Not on file  . Years of education: Not on file  . Highest education level: Not on file  Occupational History  . Not on file  Tobacco Use  . Smoking status: Never Smoker  . Smokeless tobacco: Never Used  . Tobacco comment: Smoked off and on nothing recent  Vaping Use  . Vaping Use: Never used  Substance and Sexual Activity  . Alcohol use: No    Alcohol/week: 0.0 standard drinks  . Drug use: No  . Sexual activity: Not on file  Other Topics Concern  . Not on file  Social History  Narrative   Lives with wife.   Has stepchildren x 3.   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   Social Determinants of Health   Financial Resource Strain:   . Difficulty of Paying Living Expenses:   Food Insecurity:   . Worried About Charity fundraiser in the Last Year:   . Arboriculturist in the Last Year:   Transportation Needs:   . Film/video editor (Medical):   Marland Kitchen Lack of Transportation (Non-Medical):   Physical Activity:   . Days of Exercise per Week:   . Minutes of Exercise per Session:   Stress:   . Feeling of Stress :   Social Connections:   . Frequency of Communication with Friends and Family:   . Frequency of Social Gatherings with Friends and Family:   . Attends Religious Services:   . Active Member of Clubs or Organizations:   . Attends Archivist Meetings:   Marland Kitchen Marital Status:   Intimate Partner Violence:   . Fear of Current or Ex-Partner:   . Emotionally Abused:   Marland Kitchen Physically Abused:   . Sexually Abused:      No Known Allergies   Immunization History  Administered Date(s) Administered  . Moderna SARS-COVID-2 Vaccination 07/06/2019, 08/03/2019    Outpatient Medications Prior to Visit  Medication Sig Dispense Refill  . albuterol (PROAIR HFA) 108 (90 BASE) MCG/ACT inhaler Inhale 2 puffs into the lungs every 6 (six) hours as needed for wheezing or shortness of breath.    Marland Kitchen albuterol (PROVENTIL) (2.5 MG/3ML) 0.083% nebulizer solution Take 2.5 mg by nebulization every 6 (six) hours as needed for wheezing or shortness of breath.    Marland Kitchen atorvastatin (LIPITOR) 40 MG tablet Take 40 mg by mouth daily.    . Budesonide-Formoterol Fumarate (SYMBICORT IN) Inhale into the lungs.    . furosemide (LASIX) 40 MG tablet Take 1 tablet (40 mg total) by mouth daily. (Patient taking differently: Take 80 mg by mouth daily. ) 30 tablet 1  . HUMALOG KWIKPEN 100 UNIT/ML KwikPen     . HYDROcodone-acetaminophen (NORCO/VICODIN) 5-325 MG tablet Take 1 tablet by mouth every  6 (six) hours as needed. 20 tablet 0  . Insulin Detemir (LEVEMIR) 100 UNIT/ML Pen Inject 22 Units into the skin 2 (two) times daily. (Patient taking differently: Inject 45 Units into the skin 2 (two) times daily. ) 15 mL 1  . Insulin NPH Human, Isophane, (HUMULIN N [place]) Inject into the skin.    . Insulin Pen Needle 32G X 4 MM MISC Use with insulin pens to dispense insulin 100 each 9  . ipratropium (ATROVENT HFA) 17 MCG/ACT inhaler Inhale 2 puffs into the lungs every 6 (six)  hours as needed for wheezing. 1 Inhaler 12  . KLOR-CON M20 20 MEQ tablet Take 20 mEq by mouth daily.  1  . levothyroxine (SYNTHROID, LEVOTHROID) 200 MCG tablet Take 200 mcg by mouth daily before breakfast.    . metoprolol succinate (TOPROL-XL) 50 MG 24 hr tablet Take 1 tablet (50 mg total) by mouth daily. Take with or immediately following a meal. 90 tablet 1  . Multiple Vitamin (MULTIVITAMIN) tablet Take 1 tablet by mouth daily.    Glory Rosebush VERIO test strip 3 (three) times daily. for testing    . terbinafine (LAMISIL) 250 MG tablet     . TRULICITY 7.37 TG/6.2IR SOPN SMARTSIG:0.5 Milliliter(s) SUB-Q Once a Week    . zolpidem (AMBIEN) 10 MG tablet Take 10 mg by mouth at bedtime as needed for sleep.   5  . montelukast (SINGULAIR) 10 MG tablet Take 10 mg by mouth daily.     No facility-administered medications prior to visit.    Review of Systems  Constitutional: Negative for chills and fever.  HENT: Positive for congestion.        Ear fullness, scratchy throat, sneezing  Respiratory: Positive for cough, shortness of breath and wheezing.   Cardiovascular: Positive for leg swelling. Negative for chest pain.  Gastrointestinal: Negative for blood in stool, nausea and vomiting.  Musculoskeletal: Negative for myalgias.  Skin: Negative for rash.  Neurological: Negative for weakness.  Endo/Heme/Allergies: Positive for environmental allergies.     Objective:   Vitals:   11/15/19 1616  BP: 132/84  Pulse: 80  Temp: (!)  97.3 F (36.3 C)  TempSrc: Oral  SpO2: 95%  Weight: 284 lb 12.8 oz (129.2 kg)  Height: 5\' 10"  (1.778 m)   95% on   RA BMI Readings from Last 3 Encounters:  11/15/19 40.86 kg/m  08/24/19 41.20 kg/m  05/09/19 39.46 kg/m   Wt Readings from Last 3 Encounters:  11/15/19 284 lb 12.8 oz (129.2 kg)  08/24/19 279 lb (126.6 kg)  05/09/19 275 lb (124.7 kg)    Physical Exam Vitals reviewed.  Constitutional:      General: He is not in acute distress.    Appearance: He is obese. He is not ill-appearing.  HENT:     Head: Normocephalic and atraumatic.  Eyes:     General: No scleral icterus. Cardiovascular:     Rate and Rhythm: Normal rate and regular rhythm.     Heart sounds: No murmur heard.   Pulmonary:     Comments: CTAB, breathing comfortably on RA, minimally truncated speech. Abdominal:     General: There is no distension.     Palpations: Abdomen is soft.     Tenderness: There is no abdominal tenderness.  Musculoskeletal:        General: No deformity.     Cervical back: Neck supple.     Comments: Symmetric BLE edema  Lymphadenopathy:     Cervical: No cervical adenopathy.  Skin:    General: Skin is warm and dry.     Findings: No rash.  Neurological:     General: No focal deficit present.     Mental Status: He is alert.     Coordination: Coordination normal.  Psychiatric:        Mood and Affect: Mood normal.        Behavior: Behavior normal.      CBC    Component Value Date/Time   WBC 8.6 05/09/2019 1216   RBC 4.81 05/09/2019 1216   HGB 14.9  05/09/2019 1216   HCT 45.9 05/09/2019 1216   PLT 397 05/09/2019 1216   MCV 95.4 05/09/2019 1216   MCH 31.0 05/09/2019 1216   MCHC 32.5 05/09/2019 1216   RDW 13.1 05/09/2019 1216   LYMPHSABS 2.0 04/06/2015 1608   MONOABS 0.6 04/06/2015 1608   EOSABS 0.4 04/06/2015 1608   BASOSABS 0.0 04/06/2015 1608    CHEMISTRY No results for input(s): NA, K, CL, CO2, GLUCOSE, BUN, CREATININE, CALCIUM, MG, PHOS in the last 168  hours. CrCl cannot be calculated (Patient's most recent lab result is older than the maximum 21 days allowed.).   Chest Imaging- films reviewed: CT coronary 10/09/19- lung images reviewed.  No opacities or masses; no mediastinal or hilar adenopathy.  Pulmonary Functions Testing Results: PFT Results Latest Ref Rng & Units 01/27/2016  FVC-Pre L 2.53  FVC-Predicted Pre % 52  FVC-Post L 3.05  FVC-Predicted Post % 63  Pre FEV1/FVC % % 65  Post FEV1/FCV % % 73  FEV1-Pre L 1.64  FEV1-Predicted Pre % 45  FEV1-Post L 2.21  DLCO UNC% % 65  DLCO COR %Predicted % 92   2017-severe obstruction with significant bronchodilator reversibility.  No significant restriction, hyperinflation, or air trapping.  Mildly impaired diffusion.   Echocardiogram 09/20/19:  LVEF 75-17%, normal diastolic function. Hyperdynamic RV, mildly enlarged RV.  INR 8.  Normal PASP.  IVC normal diameter with normal excursion.     Assessment & Plan:     ICD-10-CM   1. DOE (dyspnea on exertion)  R06.00 Pulmonary function test  2. Moderate persistent asthma without complication  G01.74 Pulmonary function test  3. Morbid obesity due to excess calories (HCC)  E66.01   4. Allergic rhinitis, unspecified seasonality, unspecified trigger  J30.9     DOE- most likely related to restriction from obesity.  Possibly due to moderate persistent asthma.  No evidence of ILD on the most recent CT scan. -Agree with his plans for significant weight loss.  Encouraged regular physical activity, portion control, and eating more vegetables. -Discussed recommendation for physical activity.  Shortness of breath does not have to limit his symptoms, but dizziness, lightheadedness, and chest pain are symptoms that should cause him to stop exercising. -Continue Symbicort twice daily.  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    Current Outpatient Medications:  .  albuterol (PROAIR HFA) 108 (90 BASE) MCG/ACT inhaler, Inhale 2 puffs into the lungs every 6 (six) hours as needed for wheezing or shortness of breath., Disp: , Rfl:  .  albuterol (PROVENTIL) (2.5 MG/3ML) 0.083% nebulizer solution, Take 2.5 mg by nebulization every 6 (six) hours as needed for wheezing or shortness of breath., Disp: , Rfl:  .  atorvastatin (LIPITOR) 40 MG tablet, Take 40 mg by mouth daily., Disp: , Rfl:  .  Budesonide-Formoterol Fumarate (SYMBICORT IN), Inhale into the lungs., Disp: , Rfl:  .  furosemide (LASIX) 40 MG tablet, Take 1 tablet (40 mg total) by mouth daily. (Patient taking differently: Take 80 mg by mouth daily. ), Disp: 30 tablet, Rfl: 1 .  HUMALOG KWIKPEN 100 UNIT/ML KwikPen, , Disp: , Rfl:  .  HYDROcodone-acetaminophen (NORCO/VICODIN) 5-325 MG tablet, Take 1 tablet by mouth every 6 (six) hours as needed., Disp: 20 tablet, Rfl: 0 .  Insulin Detemir (LEVEMIR) 100 UNIT/ML Pen, Inject 22 Units into the skin 2 (  two) times daily. (Patient taking differently: Inject 45 Units into the skin 2 (two) times daily. ), Disp: 15 mL, Rfl: 1 .  Insulin NPH Human, Isophane, (HUMULIN N [place]), Inject into the skin., Disp: , Rfl:  .  Insulin Pen Needle 32G X 4 MM MISC, Use with insulin pens to dispense insulin, Disp: 100 each, Rfl: 9 .  ipratropium (ATROVENT HFA) 17 MCG/ACT inhaler, Inhale 2 puffs into the lungs every 6 (six) hours as needed for wheezing., Disp: 1 Inhaler, Rfl: 12 .  KLOR-CON M20 20 MEQ tablet, Take 20 mEq by mouth daily., Disp: , Rfl: 1 .  levothyroxine (SYNTHROID, LEVOTHROID) 200 MCG tablet, Take 200 mcg by mouth daily before breakfast., Disp: , Rfl:  .  metoprolol succinate (TOPROL-XL) 50 MG 24 hr tablet, Take 1 tablet (50 mg total) by mouth daily. Take with or immediately following a meal., Disp: 90 tablet, Rfl: 1 .  montelukast (SINGULAIR) 10 MG tablet, Take 1 tablet (10  mg total) by mouth daily., Disp: 30 tablet, Rfl: 11 .  Multiple Vitamin (MULTIVITAMIN) tablet, Take 1 tablet by mouth daily., Disp: , Rfl:  .  ONETOUCH VERIO test strip, 3 (three) times daily. for testing, Disp: , Rfl:  .  terbinafine (LAMISIL) 250 MG tablet, , Disp: , Rfl:  .  TRULICITY 1.65 BX/0.3YB SOPN, SMARTSIG:0.5 Milliliter(s) SUB-Q Once a Week, Disp: , Rfl:  .  zolpidem (AMBIEN) 10 MG tablet, Take 10 mg by mouth at bedtime as needed for sleep. , Disp: , Rfl: Muldrow Chanceler Pullin, DO [facility] 11/15/2019 4:54 PM

## 2019-11-15 NOTE — Patient Instructions (Addendum)
Thank you for visiting Dr. Carlis Abbott at [facility]. We recommend the following: Orders Placed This Encounter  Procedures  . Pulmonary function test   Orders Placed This Encounter  Procedures  . Pulmonary function test    Standing Status:   Future    Standing Expiration Date:   11/14/2020    Order Specific Question:   Where should this test be performed?    Answer:   [facility]    Order Specific Question:   Full PFT: includes the following: basic spirometry, spirometry pre & post bronchodilator, diffusion capacity (DLCO), lung volumes    Answer:   Full PFT   Restart flonase once daily. Stay on symbicort two times daily. Keep using albuterol and nebulizer as needed.  Keep up with trying to lose weight!  Meds ordered this encounter  Medications  . montelukast (SINGULAIR) 10 MG tablet    Sig: Take 1 tablet (10 mg total) by mouth daily.    Dispense:  30 tablet    Refill:  11    Return in about 2 months (around 01/15/2020). after PFTs.    Please do your part to reduce the spread of COVID-19.

## 2019-12-20 ENCOUNTER — Ambulatory Visit: Payer: Medicare Other | Admitting: Cardiology

## 2019-12-20 ENCOUNTER — Encounter: Payer: Self-pay | Admitting: Cardiology

## 2019-12-20 VITALS — BP 128/58 | HR 78 | Ht 70.0 in | Wt 284.8 lb

## 2019-12-20 DIAGNOSIS — E782 Mixed hyperlipidemia: Secondary | ICD-10-CM

## 2019-12-20 DIAGNOSIS — I2510 Atherosclerotic heart disease of native coronary artery without angina pectoris: Secondary | ICD-10-CM

## 2019-12-20 NOTE — Progress Notes (Signed)
Cardiology Office Note  Date: 12/20/2019   ID: Eduardo Long, DOB 07-24-1955, MRN [id number]  PCP:  Orpah Melter, MD  Cardiologist:  Rozann Lesches, MD Electrophysiologist:  None   Chief Complaint  Patient presents with  . Cardiac follow-up    History of Present Illness: Eduardo Long is a 64 y.o. male former patient of Dr. Bronson Ing last seen in March and presenting today to establish follow-up with me.  I reviewed extensive records and updated the chart.  He underwent a cardiac CTA back in May, results outlined below.  Calcium score was relatively low at 19 and he had only mild nonobstructive atherosclerosis and a mildly dilated ascending aorta at 40 mm.  I discussed the results with him today and at this point plan would be observation on medical therapy with risk factor reduction strategies.  He was seen by Pulmonary in June, I reviewed the note.  Further testing is planned.  I reviewed his medications which are outlined below.  He does not take aspirin regularly, bruises easily.  He is on Toprol-XL along with moderate dose of Lipitor.  I did talk to him about weight loss, he is aware that he is significantly overweight and that this does negatively impact his health and exertional capacity.  Past Medical History:  Diagnosis Date  . Asthma   . Cellulitis   . Coronary atherosclerosis    Mild, nonobstructive by cardiac CTA May 2021  . Essential hypertension   . H/O seasonal allergies   . Hypothyroidism   . Mixed hyperlipidemia   . Sleep apnea    had sleep test 04-16-15, no results yet  . Type 2 diabetes mellitus ([place])     Past Surgical History:  Procedure Laterality Date  . CARPAL TUNNEL RELEASE Right 03/29/2015   Procedure: RIGHT CARPAL TUNNEL RELEASE;  Surgeon: Roseanne Kaufman, MD;  Location: [facility];  Service: Orthopedics;  Laterality: Right;  . CARPAL TUNNEL RELEASE Left   . COLONOSCOPY    . LIPOMA EXCISION N/A 05/23/2015   Procedure:  EXCISION LIPOMAS POSTERIOR AND ANTERIOR SCALP;  Surgeon: Erroll Luna, MD;  Location: [facility];  Service: General;  Laterality: N/A;  . SINUS ENDO WITH FUSION Bilateral 03/15/2015   Procedure: ENDOSCOPIC SINUS SURGERY WITH FUSION;  Surgeon: Melida Quitter, MD;  Location: [facility];  Service: ENT;  Laterality: Bilateral;    Current Outpatient Medications  Medication Sig Dispense Refill  . albuterol (PROAIR HFA) 108 (90 BASE) MCG/ACT inhaler Inhale 2 puffs into the lungs every 6 (six) hours as needed for wheezing or shortness of breath.    Marland Kitchen albuterol (PROVENTIL) (2.5 MG/3ML) 0.083% nebulizer solution Take 2.5 mg by nebulization every 6 (six) hours as needed for wheezing or shortness of breath.    Marland Kitchen atorvastatin (LIPITOR) 40 MG tablet Take 40 mg by mouth daily.    . Budesonide-Formoterol Fumarate (SYMBICORT IN) Inhale into the lungs in the morning and at bedtime.     . furosemide (LASIX) 40 MG tablet Take 1 tablet (40 mg total) by mouth daily. (Patient taking differently: Take 80 mg by mouth daily. ) 30 tablet 1  . HUMALOG KWIKPEN 100 UNIT/ML KwikPen Sliding scale.    . Insulin Detemir (LEVEMIR) 100 UNIT/ML Pen Inject 22 Units into the skin 2 (two) times daily. (Patient taking differently: Inject 70 Units into the skin 2 (two) times daily. ) 15 mL 1  . Insulin NPH Human, Isophane, (HUMULIN N [place]) Inject into the skin.    Marland Kitchen  Insulin Pen Needle 32G X 4 MM MISC Use with insulin pens to dispense insulin 100 each 9  . ipratropium (ATROVENT HFA) 17 MCG/ACT inhaler Inhale 2 puffs into the lungs every 6 (six) hours as needed for wheezing. 1 Inhaler 12  . KLOR-CON M20 20 MEQ tablet Take 20 mEq by mouth daily.  1  . levothyroxine (SYNTHROID, LEVOTHROID) 200 MCG tablet Take 200 mcg by mouth daily before breakfast.    . metoprolol succinate (TOPROL-XL) 50 MG 24 hr tablet Take 1 tablet (50 mg total) by mouth daily. Take with or immediately following a meal. 90 tablet 1  .  montelukast (SINGULAIR) 10 MG tablet Take 1 tablet (10 mg total) by mouth daily. 30 tablet 11  . Multiple Vitamin (MULTIVITAMIN) tablet Take 1 tablet by mouth daily.    Glory Rosebush VERIO test strip 3 (three) times daily. for testing    . TRULICITY 8.85 OY/7.7AJ SOPN SMARTSIG:0.5 Milliliter(s) SUB-Q Once a Week    . zolpidem (AMBIEN) 10 MG tablet Take 10 mg by mouth at bedtime as needed for sleep.   5   No current facility-administered medications for this visit.   Allergies:  Patient has no known allergies.   ROS:   No palpitations or syncope.  Physical Exam: VS:  BP (!) 128/58   Pulse 78   Ht 5\' 10"  (1.778 m)   Wt 284 lb 12.8 oz (129.2 kg)   SpO2 93%   BMI 40.86 kg/m , BMI Body mass index is 40.86 kg/m.  Wt Readings from Last 3 Encounters:  12/20/19 284 lb 12.8 oz (129.2 kg)  11/15/19 284 lb 12.8 oz (129.2 kg)  08/24/19 279 lb (126.6 kg)    General: Morbidly obese male, appears comfortable at rest. HEENT: Conjunctiva and lids normal, wearing a mask. Neck: Supple, no elevated JVP or carotid bruits, no thyromegaly. Lungs: Clear to auscultation, nonlabored breathing at rest. Cardiac: Regular rate and rhythm, no S3 or significant systolic murmur, no pericardial rub. Abdomen: Protuberant, bowel sounds present, no guarding or rebound. Extremities: No pitting edema, distal pulses 2+.  ECG:  An ECG dated 08/24/2019 was personally reviewed today and demonstrated:  Sinus rhythm with incomplete right bundle branch block.  Recent Labwork: 05/09/2019: B Natriuretic Peptide 69.0; Hemoglobin 14.9; Platelets 397 09/12/2019: BUN 23; Creatinine, Ser 1.19; Potassium 4.1; Sodium 141   Other Studies Reviewed Today:  Cardiac CTA 10/09/2019: Aorta: Normal size. Mild aortic arch calcifications. No dissection.  Aortic Valve:  Trileaflet.  No calcifications.  Coronary Arteries:  Normal coronary origin.  Right dominance.  RCA is a large dominant artery that gives rise to PDA and PLA. There is  small focal area of calcified non flow limiting plaque (0-24%).  Left main is a large artery that gives rise to LAD and LCX arteries (high OM branch).  LAD is a large vessel that has no plaque.  LCX is a non-dominant artery that gives rise to one large OM1 branch. There is proximal calcified non flow limiting plaque (0-24%).  Other findings:  Normal pulmonary vein drainage into the left atrium.  Normal left atrial appendage without a thrombus.  Normal size of the pulmonary artery.  IMPRESSION: 1. Coronary calcium score of 19. This was 5 percentile for age and sex matched control.  2. Normal coronary origin with right dominance.  3. Mild non flow limiting mid RCA and proximal circumflex calcified plaque (0-24% stenosis).  4.  Mildly dilated ascending aorta (40 mm).  5.  Mild aortic arch atherosclerosis.  6. CAD-RADS 1. Minimal non-obstructive CAD (0-24%). Consider non-atherosclerotic causes of chest pain. Consider preventive therapy and risk factor modification.  Assessment and Plan:  1.  Mild, nonobstructive coronary atherosclerosis with calcium score of only 19 by cardiac CTA in May of this year.  Recommend risk factor modification strategies.  He is already on moderate dose Lipitor, would aim for LDL under 70.  He is not on regular aspirin with easy bruising.  2.  Morbid obesity, BMI 41.  I talked with him today about the importance of weight loss.  3.  Essential hypertension, blood pressure is adequately controlled today.  He is on Toprol-XL.  4.  Chronic leg edema, venous stasis evident.  He is on Lasix.  Weight loss would be helpful.  Echocardiogram from April revealed LVEF 55 to 60%.  Medication Adjustments/Labs and Tests Ordered: Current medicines are reviewed at length with the patient today.  Concerns regarding medicines are outlined above.   Tests Ordered: No orders of the defined types were placed in this encounter.   Medication  Changes: No orders of the defined types were placed in this encounter.   Disposition:  Follow up 1 year in the [facility] office.  Signed, Satira Sark, MD, [facility] 12/20/2019 4:05 PM    [facility] [place], [place], [place] [address] Phone: [phone]; Fax: [phone]

## 2019-12-20 NOTE — Patient Instructions (Addendum)

## 2019-12-27 ENCOUNTER — Telehealth: Payer: Medicare Other | Admitting: Cardiovascular Disease

## 2019-12-29 ENCOUNTER — Ambulatory Visit (INDEPENDENT_AMBULATORY_CARE_PROVIDER_SITE_OTHER): Payer: Medicare Other | Admitting: Critical Care Medicine

## 2019-12-29 ENCOUNTER — Other Ambulatory Visit: Payer: Self-pay

## 2019-12-29 DIAGNOSIS — R0600 Dyspnea, unspecified: Secondary | ICD-10-CM | POA: Diagnosis not present

## 2019-12-29 DIAGNOSIS — J4540 Moderate persistent asthma, uncomplicated: Secondary | ICD-10-CM

## 2019-12-29 DIAGNOSIS — R0609 Other forms of dyspnea: Secondary | ICD-10-CM

## 2019-12-29 LAB — PULMONARY FUNCTION TEST
DL/VA % pred: 101 %
DL/VA: 4.2 ml/min/mmHg/L
DLCO cor % pred: 74 %
DLCO cor: 20.13 ml/min/mmHg
DLCO unc % pred: 74 %
DLCO unc: 20.13 ml/min/mmHg
FEF 25-75 Post: 1.19 L/sec
FEF 25-75 Pre: 0.83 L/sec
FEF2575-%Change-Post: 43 %
FEF2575-%Pred-Post: 43 %
FEF2575-%Pred-Pre: 30 %
FEV1-%Change-Post: 9 %
FEV1-%Pred-Post: 49 %
FEV1-%Pred-Pre: 45 %
FEV1-Post: 1.74 L
FEV1-Pre: 1.59 L
FEV1FVC-%Change-Post: 6 %
FEV1FVC-%Pred-Pre: 90 %
FEV6-%Change-Post: 4 %
FEV6-%Pred-Post: 54 %
FEV6-%Pred-Pre: 51 %
FEV6-Post: 2.41 L
FEV6-Pre: 2.31 L
FEV6FVC-%Change-Post: 2 %
FEV6FVC-%Pred-Post: 104 %
FEV6FVC-%Pred-Pre: 102 %
FVC-%Change-Post: 2 %
FVC-%Pred-Post: 51 %
FVC-%Pred-Pre: 50 %
FVC-Post: 2.42 L
FVC-Pre: 2.37 L
Post FEV1/FVC ratio: 72 %
Post FEV6/FVC ratio: 100 %
Pre FEV1/FVC ratio: 67 %
Pre FEV6/FVC Ratio: 98 %

## 2019-12-29 NOTE — Progress Notes (Signed)
PFT done today. 

## 2020-06-06 DIAGNOSIS — I10 Essential (primary) hypertension: Secondary | ICD-10-CM | POA: Diagnosis not present

## 2020-06-06 DIAGNOSIS — E039 Hypothyroidism, unspecified: Secondary | ICD-10-CM | POA: Diagnosis not present

## 2020-06-06 DIAGNOSIS — E1165 Type 2 diabetes mellitus with hyperglycemia: Secondary | ICD-10-CM | POA: Diagnosis not present

## 2020-06-06 DIAGNOSIS — E7800 Pure hypercholesterolemia, unspecified: Secondary | ICD-10-CM | POA: Diagnosis not present

## 2020-06-11 DIAGNOSIS — I10 Essential (primary) hypertension: Secondary | ICD-10-CM | POA: Diagnosis not present

## 2020-06-11 DIAGNOSIS — J45909 Unspecified asthma, uncomplicated: Secondary | ICD-10-CM | POA: Diagnosis not present

## 2020-06-11 DIAGNOSIS — E7800 Pure hypercholesterolemia, unspecified: Secondary | ICD-10-CM | POA: Diagnosis not present

## 2020-06-11 DIAGNOSIS — E039 Hypothyroidism, unspecified: Secondary | ICD-10-CM | POA: Diagnosis not present

## 2020-06-11 DIAGNOSIS — E1165 Type 2 diabetes mellitus with hyperglycemia: Secondary | ICD-10-CM | POA: Diagnosis not present

## 2020-06-12 DIAGNOSIS — G4733 Obstructive sleep apnea (adult) (pediatric): Secondary | ICD-10-CM | POA: Diagnosis not present

## 2020-06-19 DIAGNOSIS — G4733 Obstructive sleep apnea (adult) (pediatric): Secondary | ICD-10-CM | POA: Diagnosis not present

## 2020-06-20 DIAGNOSIS — M79671 Pain in right foot: Secondary | ICD-10-CM | POA: Diagnosis not present

## 2020-06-20 DIAGNOSIS — M79672 Pain in left foot: Secondary | ICD-10-CM | POA: Diagnosis not present

## 2020-06-20 DIAGNOSIS — E1140 Type 2 diabetes mellitus with diabetic neuropathy, unspecified: Secondary | ICD-10-CM | POA: Diagnosis not present

## 2020-06-20 DIAGNOSIS — I739 Peripheral vascular disease, unspecified: Secondary | ICD-10-CM | POA: Diagnosis not present

## 2020-06-30 DIAGNOSIS — J45909 Unspecified asthma, uncomplicated: Secondary | ICD-10-CM | POA: Diagnosis not present

## 2020-06-30 DIAGNOSIS — E039 Hypothyroidism, unspecified: Secondary | ICD-10-CM | POA: Diagnosis not present

## 2020-06-30 DIAGNOSIS — J4541 Moderate persistent asthma with (acute) exacerbation: Secondary | ICD-10-CM | POA: Diagnosis not present

## 2020-06-30 DIAGNOSIS — E782 Mixed hyperlipidemia: Secondary | ICD-10-CM | POA: Diagnosis not present

## 2020-06-30 DIAGNOSIS — J45901 Unspecified asthma with (acute) exacerbation: Secondary | ICD-10-CM | POA: Diagnosis not present

## 2020-06-30 DIAGNOSIS — G4700 Insomnia, unspecified: Secondary | ICD-10-CM | POA: Diagnosis not present

## 2020-06-30 DIAGNOSIS — E1140 Type 2 diabetes mellitus with diabetic neuropathy, unspecified: Secondary | ICD-10-CM | POA: Diagnosis not present

## 2020-07-11 DIAGNOSIS — Z794 Long term (current) use of insulin: Secondary | ICD-10-CM | POA: Diagnosis not present

## 2020-07-11 DIAGNOSIS — E1165 Type 2 diabetes mellitus with hyperglycemia: Secondary | ICD-10-CM | POA: Diagnosis not present

## 2020-07-29 DIAGNOSIS — E039 Hypothyroidism, unspecified: Secondary | ICD-10-CM | POA: Diagnosis not present

## 2020-07-29 DIAGNOSIS — J4541 Moderate persistent asthma with (acute) exacerbation: Secondary | ICD-10-CM | POA: Diagnosis not present

## 2020-07-29 DIAGNOSIS — J45901 Unspecified asthma with (acute) exacerbation: Secondary | ICD-10-CM | POA: Diagnosis not present

## 2020-07-29 DIAGNOSIS — E1140 Type 2 diabetes mellitus with diabetic neuropathy, unspecified: Secondary | ICD-10-CM | POA: Diagnosis not present

## 2020-07-29 DIAGNOSIS — G4700 Insomnia, unspecified: Secondary | ICD-10-CM | POA: Diagnosis not present

## 2020-07-29 DIAGNOSIS — E782 Mixed hyperlipidemia: Secondary | ICD-10-CM | POA: Diagnosis not present

## 2020-07-29 DIAGNOSIS — J45909 Unspecified asthma, uncomplicated: Secondary | ICD-10-CM | POA: Diagnosis not present

## 2020-08-28 DIAGNOSIS — E039 Hypothyroidism, unspecified: Secondary | ICD-10-CM | POA: Diagnosis not present

## 2020-08-28 DIAGNOSIS — E782 Mixed hyperlipidemia: Secondary | ICD-10-CM | POA: Diagnosis not present

## 2020-08-28 DIAGNOSIS — G4700 Insomnia, unspecified: Secondary | ICD-10-CM | POA: Diagnosis not present

## 2020-08-28 DIAGNOSIS — E1140 Type 2 diabetes mellitus with diabetic neuropathy, unspecified: Secondary | ICD-10-CM | POA: Diagnosis not present

## 2020-08-28 DIAGNOSIS — J4541 Moderate persistent asthma with (acute) exacerbation: Secondary | ICD-10-CM | POA: Diagnosis not present

## 2020-08-28 DIAGNOSIS — J45901 Unspecified asthma with (acute) exacerbation: Secondary | ICD-10-CM | POA: Diagnosis not present

## 2020-08-28 DIAGNOSIS — J45909 Unspecified asthma, uncomplicated: Secondary | ICD-10-CM | POA: Diagnosis not present

## 2020-08-29 DIAGNOSIS — E1140 Type 2 diabetes mellitus with diabetic neuropathy, unspecified: Secondary | ICD-10-CM | POA: Diagnosis not present

## 2020-08-29 DIAGNOSIS — M79671 Pain in right foot: Secondary | ICD-10-CM | POA: Diagnosis not present

## 2020-08-29 DIAGNOSIS — I739 Peripheral vascular disease, unspecified: Secondary | ICD-10-CM | POA: Diagnosis not present

## 2020-08-29 DIAGNOSIS — M79672 Pain in left foot: Secondary | ICD-10-CM | POA: Diagnosis not present

## 2020-09-17 DIAGNOSIS — G4733 Obstructive sleep apnea (adult) (pediatric): Secondary | ICD-10-CM | POA: Diagnosis not present

## 2020-09-20 DIAGNOSIS — J4541 Moderate persistent asthma with (acute) exacerbation: Secondary | ICD-10-CM | POA: Diagnosis not present

## 2020-09-28 DIAGNOSIS — J45909 Unspecified asthma, uncomplicated: Secondary | ICD-10-CM | POA: Diagnosis not present

## 2020-09-28 DIAGNOSIS — E782 Mixed hyperlipidemia: Secondary | ICD-10-CM | POA: Diagnosis not present

## 2020-09-28 DIAGNOSIS — E1140 Type 2 diabetes mellitus with diabetic neuropathy, unspecified: Secondary | ICD-10-CM | POA: Diagnosis not present

## 2020-09-28 DIAGNOSIS — E039 Hypothyroidism, unspecified: Secondary | ICD-10-CM | POA: Diagnosis not present

## 2020-09-28 DIAGNOSIS — J4541 Moderate persistent asthma with (acute) exacerbation: Secondary | ICD-10-CM | POA: Diagnosis not present

## 2020-09-28 DIAGNOSIS — J45901 Unspecified asthma with (acute) exacerbation: Secondary | ICD-10-CM | POA: Diagnosis not present

## 2020-09-28 DIAGNOSIS — G4700 Insomnia, unspecified: Secondary | ICD-10-CM | POA: Diagnosis not present

## 2020-09-30 DIAGNOSIS — E1165 Type 2 diabetes mellitus with hyperglycemia: Secondary | ICD-10-CM | POA: Diagnosis not present

## 2020-09-30 DIAGNOSIS — Z794 Long term (current) use of insulin: Secondary | ICD-10-CM | POA: Diagnosis not present

## 2020-10-03 DIAGNOSIS — Z23 Encounter for immunization: Secondary | ICD-10-CM | POA: Diagnosis not present

## 2020-10-29 DIAGNOSIS — E039 Hypothyroidism, unspecified: Secondary | ICD-10-CM | POA: Diagnosis not present

## 2020-10-29 DIAGNOSIS — G4700 Insomnia, unspecified: Secondary | ICD-10-CM | POA: Diagnosis not present

## 2020-10-29 DIAGNOSIS — E1140 Type 2 diabetes mellitus with diabetic neuropathy, unspecified: Secondary | ICD-10-CM | POA: Diagnosis not present

## 2020-10-29 DIAGNOSIS — J45909 Unspecified asthma, uncomplicated: Secondary | ICD-10-CM | POA: Diagnosis not present

## 2020-10-29 DIAGNOSIS — J4541 Moderate persistent asthma with (acute) exacerbation: Secondary | ICD-10-CM | POA: Diagnosis not present

## 2020-10-29 DIAGNOSIS — J45901 Unspecified asthma with (acute) exacerbation: Secondary | ICD-10-CM | POA: Diagnosis not present

## 2020-10-29 DIAGNOSIS — E782 Mixed hyperlipidemia: Secondary | ICD-10-CM | POA: Diagnosis not present

## 2020-11-07 DIAGNOSIS — I739 Peripheral vascular disease, unspecified: Secondary | ICD-10-CM | POA: Diagnosis not present

## 2020-11-07 DIAGNOSIS — E1140 Type 2 diabetes mellitus with diabetic neuropathy, unspecified: Secondary | ICD-10-CM | POA: Diagnosis not present

## 2020-11-07 DIAGNOSIS — M79672 Pain in left foot: Secondary | ICD-10-CM | POA: Diagnosis not present

## 2020-11-07 DIAGNOSIS — M79671 Pain in right foot: Secondary | ICD-10-CM | POA: Diagnosis not present

## 2020-11-28 DIAGNOSIS — J4541 Moderate persistent asthma with (acute) exacerbation: Secondary | ICD-10-CM | POA: Diagnosis not present

## 2020-11-28 DIAGNOSIS — J45901 Unspecified asthma with (acute) exacerbation: Secondary | ICD-10-CM | POA: Diagnosis not present

## 2020-11-28 DIAGNOSIS — E039 Hypothyroidism, unspecified: Secondary | ICD-10-CM | POA: Diagnosis not present

## 2020-11-28 DIAGNOSIS — J45909 Unspecified asthma, uncomplicated: Secondary | ICD-10-CM | POA: Diagnosis not present

## 2020-11-28 DIAGNOSIS — E782 Mixed hyperlipidemia: Secondary | ICD-10-CM | POA: Diagnosis not present

## 2020-11-28 DIAGNOSIS — G4700 Insomnia, unspecified: Secondary | ICD-10-CM | POA: Diagnosis not present

## 2020-11-28 DIAGNOSIS — E1140 Type 2 diabetes mellitus with diabetic neuropathy, unspecified: Secondary | ICD-10-CM | POA: Diagnosis not present

## 2020-12-16 DIAGNOSIS — J4541 Moderate persistent asthma with (acute) exacerbation: Secondary | ICD-10-CM | POA: Diagnosis not present

## 2020-12-16 DIAGNOSIS — E782 Mixed hyperlipidemia: Secondary | ICD-10-CM | POA: Diagnosis not present

## 2020-12-16 DIAGNOSIS — E039 Hypothyroidism, unspecified: Secondary | ICD-10-CM | POA: Diagnosis not present

## 2020-12-16 DIAGNOSIS — J45901 Unspecified asthma with (acute) exacerbation: Secondary | ICD-10-CM | POA: Diagnosis not present

## 2020-12-16 DIAGNOSIS — J45909 Unspecified asthma, uncomplicated: Secondary | ICD-10-CM | POA: Diagnosis not present

## 2020-12-16 DIAGNOSIS — E1140 Type 2 diabetes mellitus with diabetic neuropathy, unspecified: Secondary | ICD-10-CM | POA: Diagnosis not present

## 2020-12-16 DIAGNOSIS — G4700 Insomnia, unspecified: Secondary | ICD-10-CM | POA: Diagnosis not present

## 2020-12-19 DIAGNOSIS — E1165 Type 2 diabetes mellitus with hyperglycemia: Secondary | ICD-10-CM | POA: Diagnosis not present

## 2020-12-19 DIAGNOSIS — Z794 Long term (current) use of insulin: Secondary | ICD-10-CM | POA: Diagnosis not present

## 2020-12-19 DIAGNOSIS — G4733 Obstructive sleep apnea (adult) (pediatric): Secondary | ICD-10-CM | POA: Diagnosis not present

## 2020-12-31 DIAGNOSIS — I10 Essential (primary) hypertension: Secondary | ICD-10-CM | POA: Diagnosis not present

## 2020-12-31 DIAGNOSIS — E039 Hypothyroidism, unspecified: Secondary | ICD-10-CM | POA: Diagnosis not present

## 2020-12-31 DIAGNOSIS — E1165 Type 2 diabetes mellitus with hyperglycemia: Secondary | ICD-10-CM | POA: Diagnosis not present

## 2021-01-03 DIAGNOSIS — E7800 Pure hypercholesterolemia, unspecified: Secondary | ICD-10-CM | POA: Diagnosis not present

## 2021-01-03 DIAGNOSIS — J45909 Unspecified asthma, uncomplicated: Secondary | ICD-10-CM | POA: Diagnosis not present

## 2021-01-03 DIAGNOSIS — I10 Essential (primary) hypertension: Secondary | ICD-10-CM | POA: Diagnosis not present

## 2021-01-03 DIAGNOSIS — E039 Hypothyroidism, unspecified: Secondary | ICD-10-CM | POA: Diagnosis not present

## 2021-01-03 DIAGNOSIS — E1165 Type 2 diabetes mellitus with hyperglycemia: Secondary | ICD-10-CM | POA: Diagnosis not present

## 2021-01-09 DIAGNOSIS — I5189 Other ill-defined heart diseases: Secondary | ICD-10-CM | POA: Diagnosis not present

## 2021-01-09 DIAGNOSIS — Z0000 Encounter for general adult medical examination without abnormal findings: Secondary | ICD-10-CM | POA: Diagnosis not present

## 2021-01-09 DIAGNOSIS — E782 Mixed hyperlipidemia: Secondary | ICD-10-CM | POA: Diagnosis not present

## 2021-01-09 DIAGNOSIS — J45909 Unspecified asthma, uncomplicated: Secondary | ICD-10-CM | POA: Diagnosis not present

## 2021-01-09 DIAGNOSIS — K429 Umbilical hernia without obstruction or gangrene: Secondary | ICD-10-CM | POA: Diagnosis not present

## 2021-01-09 DIAGNOSIS — G4700 Insomnia, unspecified: Secondary | ICD-10-CM | POA: Diagnosis not present

## 2021-01-11 DIAGNOSIS — E119 Type 2 diabetes mellitus without complications: Secondary | ICD-10-CM | POA: Diagnosis not present

## 2021-01-11 DIAGNOSIS — H40033 Anatomical narrow angle, bilateral: Secondary | ICD-10-CM | POA: Diagnosis not present

## 2021-01-16 DIAGNOSIS — J45909 Unspecified asthma, uncomplicated: Secondary | ICD-10-CM | POA: Diagnosis not present

## 2021-01-16 DIAGNOSIS — J45901 Unspecified asthma with (acute) exacerbation: Secondary | ICD-10-CM | POA: Diagnosis not present

## 2021-01-16 DIAGNOSIS — E039 Hypothyroidism, unspecified: Secondary | ICD-10-CM | POA: Diagnosis not present

## 2021-01-16 DIAGNOSIS — J4541 Moderate persistent asthma with (acute) exacerbation: Secondary | ICD-10-CM | POA: Diagnosis not present

## 2021-01-16 DIAGNOSIS — E1140 Type 2 diabetes mellitus with diabetic neuropathy, unspecified: Secondary | ICD-10-CM | POA: Diagnosis not present

## 2021-01-16 DIAGNOSIS — E782 Mixed hyperlipidemia: Secondary | ICD-10-CM | POA: Diagnosis not present

## 2021-01-16 DIAGNOSIS — G4700 Insomnia, unspecified: Secondary | ICD-10-CM | POA: Diagnosis not present

## 2021-02-07 DIAGNOSIS — M79672 Pain in left foot: Secondary | ICD-10-CM | POA: Diagnosis not present

## 2021-02-07 DIAGNOSIS — M79671 Pain in right foot: Secondary | ICD-10-CM | POA: Diagnosis not present

## 2021-02-07 DIAGNOSIS — E1140 Type 2 diabetes mellitus with diabetic neuropathy, unspecified: Secondary | ICD-10-CM | POA: Diagnosis not present

## 2021-02-07 DIAGNOSIS — I739 Peripheral vascular disease, unspecified: Secondary | ICD-10-CM | POA: Diagnosis not present

## 2021-02-24 DIAGNOSIS — J45909 Unspecified asthma, uncomplicated: Secondary | ICD-10-CM | POA: Diagnosis not present

## 2021-02-24 DIAGNOSIS — E1140 Type 2 diabetes mellitus with diabetic neuropathy, unspecified: Secondary | ICD-10-CM | POA: Diagnosis not present

## 2021-02-24 DIAGNOSIS — G4700 Insomnia, unspecified: Secondary | ICD-10-CM | POA: Diagnosis not present

## 2021-02-24 DIAGNOSIS — J45901 Unspecified asthma with (acute) exacerbation: Secondary | ICD-10-CM | POA: Diagnosis not present

## 2021-02-24 DIAGNOSIS — E039 Hypothyroidism, unspecified: Secondary | ICD-10-CM | POA: Diagnosis not present

## 2021-02-24 DIAGNOSIS — J4541 Moderate persistent asthma with (acute) exacerbation: Secondary | ICD-10-CM | POA: Diagnosis not present

## 2021-02-24 DIAGNOSIS — E782 Mixed hyperlipidemia: Secondary | ICD-10-CM | POA: Diagnosis not present

## 2021-03-11 DIAGNOSIS — L03114 Cellulitis of left upper limb: Secondary | ICD-10-CM | POA: Diagnosis not present

## 2021-03-11 DIAGNOSIS — R030 Elevated blood-pressure reading, without diagnosis of hypertension: Secondary | ICD-10-CM | POA: Diagnosis not present

## 2021-03-11 DIAGNOSIS — I509 Heart failure, unspecified: Secondary | ICD-10-CM | POA: Diagnosis not present

## 2021-03-11 DIAGNOSIS — R059 Cough, unspecified: Secondary | ICD-10-CM | POA: Diagnosis not present

## 2021-03-11 DIAGNOSIS — R509 Fever, unspecified: Secondary | ICD-10-CM | POA: Diagnosis not present

## 2021-03-11 DIAGNOSIS — I700 Atherosclerosis of aorta: Secondary | ICD-10-CM | POA: Diagnosis not present

## 2021-03-11 DIAGNOSIS — E119 Type 2 diabetes mellitus without complications: Secondary | ICD-10-CM | POA: Diagnosis not present

## 2021-03-11 DIAGNOSIS — R9431 Abnormal electrocardiogram [ECG] [EKG]: Secondary | ICD-10-CM | POA: Diagnosis not present

## 2021-03-11 DIAGNOSIS — J441 Chronic obstructive pulmonary disease with (acute) exacerbation: Secondary | ICD-10-CM | POA: Diagnosis not present

## 2021-03-11 DIAGNOSIS — I4510 Unspecified right bundle-branch block: Secondary | ICD-10-CM | POA: Diagnosis not present

## 2021-03-11 DIAGNOSIS — R079 Chest pain, unspecified: Secondary | ICD-10-CM | POA: Diagnosis not present

## 2021-03-11 DIAGNOSIS — R1031 Right lower quadrant pain: Secondary | ICD-10-CM | POA: Diagnosis not present

## 2021-03-11 DIAGNOSIS — Z7982 Long term (current) use of aspirin: Secondary | ICD-10-CM | POA: Diagnosis not present

## 2021-03-11 DIAGNOSIS — L03311 Cellulitis of abdominal wall: Secondary | ICD-10-CM | POA: Diagnosis not present

## 2021-03-11 DIAGNOSIS — T63441A Toxic effect of venom of bees, accidental (unintentional), initial encounter: Secondary | ICD-10-CM | POA: Diagnosis not present

## 2021-03-11 DIAGNOSIS — Z20822 Contact with and (suspected) exposure to covid-19: Secondary | ICD-10-CM | POA: Diagnosis not present

## 2021-03-11 DIAGNOSIS — R0682 Tachypnea, not elsewhere classified: Secondary | ICD-10-CM | POA: Diagnosis not present

## 2021-03-11 DIAGNOSIS — R0902 Hypoxemia: Secondary | ICD-10-CM | POA: Diagnosis not present

## 2021-03-11 DIAGNOSIS — R10811 Right upper quadrant abdominal tenderness: Secondary | ICD-10-CM | POA: Diagnosis not present

## 2021-03-11 DIAGNOSIS — Z794 Long term (current) use of insulin: Secondary | ICD-10-CM | POA: Diagnosis not present

## 2021-03-11 DIAGNOSIS — K429 Umbilical hernia without obstruction or gangrene: Secondary | ICD-10-CM | POA: Diagnosis not present

## 2021-03-11 DIAGNOSIS — R300 Dysuria: Secondary | ICD-10-CM | POA: Diagnosis not present

## 2021-03-11 DIAGNOSIS — R000 Tachycardia, unspecified: Secondary | ICD-10-CM | POA: Diagnosis not present

## 2021-03-11 DIAGNOSIS — E039 Hypothyroidism, unspecified: Secondary | ICD-10-CM | POA: Diagnosis not present

## 2021-03-11 DIAGNOSIS — R109 Unspecified abdominal pain: Secondary | ICD-10-CM | POA: Diagnosis not present

## 2021-03-11 DIAGNOSIS — R1032 Left lower quadrant pain: Secondary | ICD-10-CM | POA: Diagnosis not present

## 2021-03-11 DIAGNOSIS — M791 Myalgia, unspecified site: Secondary | ICD-10-CM | POA: Diagnosis not present

## 2021-03-12 DIAGNOSIS — R000 Tachycardia, unspecified: Secondary | ICD-10-CM | POA: Diagnosis not present

## 2021-03-12 DIAGNOSIS — R109 Unspecified abdominal pain: Secondary | ICD-10-CM | POA: Diagnosis not present

## 2021-03-12 DIAGNOSIS — R509 Fever, unspecified: Secondary | ICD-10-CM | POA: Diagnosis not present

## 2021-03-12 DIAGNOSIS — R0902 Hypoxemia: Secondary | ICD-10-CM | POA: Diagnosis not present

## 2021-03-12 DIAGNOSIS — R059 Cough, unspecified: Secondary | ICD-10-CM | POA: Diagnosis not present

## 2021-03-12 DIAGNOSIS — R079 Chest pain, unspecified: Secondary | ICD-10-CM | POA: Diagnosis not present

## 2021-03-14 DIAGNOSIS — J449 Chronic obstructive pulmonary disease, unspecified: Secondary | ICD-10-CM | POA: Diagnosis not present

## 2021-03-14 DIAGNOSIS — L03312 Cellulitis of back [any part except buttock]: Secondary | ICD-10-CM | POA: Diagnosis not present

## 2021-03-19 DIAGNOSIS — I998 Other disorder of circulatory system: Secondary | ICD-10-CM | POA: Diagnosis not present

## 2021-03-19 DIAGNOSIS — I5189 Other ill-defined heart diseases: Secondary | ICD-10-CM | POA: Diagnosis not present

## 2021-03-27 DIAGNOSIS — L03031 Cellulitis of right toe: Secondary | ICD-10-CM | POA: Diagnosis not present

## 2021-04-03 DIAGNOSIS — J45909 Unspecified asthma, uncomplicated: Secondary | ICD-10-CM | POA: Diagnosis not present

## 2021-04-03 DIAGNOSIS — E1140 Type 2 diabetes mellitus with diabetic neuropathy, unspecified: Secondary | ICD-10-CM | POA: Diagnosis not present

## 2021-04-03 DIAGNOSIS — J449 Chronic obstructive pulmonary disease, unspecified: Secondary | ICD-10-CM | POA: Diagnosis not present

## 2021-04-03 DIAGNOSIS — E782 Mixed hyperlipidemia: Secondary | ICD-10-CM | POA: Diagnosis not present

## 2021-04-03 DIAGNOSIS — J45901 Unspecified asthma with (acute) exacerbation: Secondary | ICD-10-CM | POA: Diagnosis not present

## 2021-04-03 DIAGNOSIS — E039 Hypothyroidism, unspecified: Secondary | ICD-10-CM | POA: Diagnosis not present

## 2021-04-03 DIAGNOSIS — G4700 Insomnia, unspecified: Secondary | ICD-10-CM | POA: Diagnosis not present

## 2021-04-03 DIAGNOSIS — J4541 Moderate persistent asthma with (acute) exacerbation: Secondary | ICD-10-CM | POA: Diagnosis not present

## 2021-04-10 DIAGNOSIS — S79921A Unspecified injury of right thigh, initial encounter: Secondary | ICD-10-CM | POA: Diagnosis not present

## 2021-04-10 DIAGNOSIS — S41111A Laceration without foreign body of right upper arm, initial encounter: Secondary | ICD-10-CM | POA: Diagnosis not present

## 2021-04-10 DIAGNOSIS — I4510 Unspecified right bundle-branch block: Secondary | ICD-10-CM | POA: Diagnosis not present

## 2021-04-10 DIAGNOSIS — S32049A Unspecified fracture of fourth lumbar vertebra, initial encounter for closed fracture: Secondary | ICD-10-CM | POA: Diagnosis not present

## 2021-04-10 DIAGNOSIS — Y92410 Unspecified street and highway as the place of occurrence of the external cause: Secondary | ICD-10-CM | POA: Diagnosis not present

## 2021-04-10 DIAGNOSIS — S32048A Other fracture of fourth lumbar vertebra, initial encounter for closed fracture: Secondary | ICD-10-CM | POA: Diagnosis not present

## 2021-04-10 DIAGNOSIS — S0990XA Unspecified injury of head, initial encounter: Secondary | ICD-10-CM | POA: Diagnosis not present

## 2021-04-10 DIAGNOSIS — S32010A Wedge compression fracture of first lumbar vertebra, initial encounter for closed fracture: Secondary | ICD-10-CM | POA: Diagnosis not present

## 2021-04-10 DIAGNOSIS — R404 Transient alteration of awareness: Secondary | ICD-10-CM | POA: Diagnosis not present

## 2021-04-10 DIAGNOSIS — S12390A Other displaced fracture of fourth cervical vertebra, initial encounter for closed fracture: Secondary | ICD-10-CM | POA: Diagnosis not present

## 2021-04-10 DIAGNOSIS — S59912A Unspecified injury of left forearm, initial encounter: Secondary | ICD-10-CM | POA: Diagnosis not present

## 2021-04-10 DIAGNOSIS — S2243XD Multiple fractures of ribs, bilateral, subsequent encounter for fracture with routine healing: Secondary | ICD-10-CM | POA: Diagnosis not present

## 2021-04-10 DIAGNOSIS — Z833 Family history of diabetes mellitus: Secondary | ICD-10-CM | POA: Diagnosis not present

## 2021-04-10 DIAGNOSIS — R10819 Abdominal tenderness, unspecified site: Secondary | ICD-10-CM | POA: Diagnosis not present

## 2021-04-10 DIAGNOSIS — E1169 Type 2 diabetes mellitus with other specified complication: Secondary | ICD-10-CM | POA: Diagnosis not present

## 2021-04-10 DIAGNOSIS — S32018A Other fracture of first lumbar vertebra, initial encounter for closed fracture: Secondary | ICD-10-CM | POA: Diagnosis not present

## 2021-04-10 DIAGNOSIS — R079 Chest pain, unspecified: Secondary | ICD-10-CM | POA: Diagnosis not present

## 2021-04-10 DIAGNOSIS — E1142 Type 2 diabetes mellitus with diabetic polyneuropathy: Secondary | ICD-10-CM | POA: Diagnosis not present

## 2021-04-10 DIAGNOSIS — S0003XA Contusion of scalp, initial encounter: Secondary | ICD-10-CM | POA: Diagnosis not present

## 2021-04-10 DIAGNOSIS — M4312 Spondylolisthesis, cervical region: Secondary | ICD-10-CM | POA: Diagnosis not present

## 2021-04-10 DIAGNOSIS — K429 Umbilical hernia without obstruction or gangrene: Secondary | ICD-10-CM | POA: Diagnosis not present

## 2021-04-10 DIAGNOSIS — M2578 Osteophyte, vertebrae: Secondary | ICD-10-CM | POA: Diagnosis not present

## 2021-04-10 DIAGNOSIS — S12391A Other nondisplaced fracture of fourth cervical vertebra, initial encounter for closed fracture: Secondary | ICD-10-CM | POA: Diagnosis not present

## 2021-04-10 DIAGNOSIS — S79922A Unspecified injury of left thigh, initial encounter: Secondary | ICD-10-CM | POA: Diagnosis not present

## 2021-04-10 DIAGNOSIS — S59902A Unspecified injury of left elbow, initial encounter: Secondary | ICD-10-CM | POA: Diagnosis not present

## 2021-04-10 DIAGNOSIS — I2510 Atherosclerotic heart disease of native coronary artery without angina pectoris: Secondary | ICD-10-CM | POA: Diagnosis not present

## 2021-04-10 DIAGNOSIS — S3991XA Unspecified injury of abdomen, initial encounter: Secondary | ICD-10-CM | POA: Diagnosis not present

## 2021-04-10 DIAGNOSIS — Z794 Long term (current) use of insulin: Secondary | ICD-10-CM | POA: Diagnosis not present

## 2021-04-10 DIAGNOSIS — G4700 Insomnia, unspecified: Secondary | ICD-10-CM | POA: Diagnosis not present

## 2021-04-10 DIAGNOSIS — I5032 Chronic diastolic (congestive) heart failure: Secondary | ICD-10-CM | POA: Diagnosis not present

## 2021-04-10 DIAGNOSIS — Z743 Need for continuous supervision: Secondary | ICD-10-CM | POA: Diagnosis not present

## 2021-04-10 DIAGNOSIS — S7012XA Contusion of left thigh, initial encounter: Secondary | ICD-10-CM | POA: Diagnosis not present

## 2021-04-10 DIAGNOSIS — S2220XA Unspecified fracture of sternum, initial encounter for closed fracture: Secondary | ICD-10-CM | POA: Diagnosis not present

## 2021-04-10 DIAGNOSIS — Z9981 Dependence on supplemental oxygen: Secondary | ICD-10-CM | POA: Diagnosis not present

## 2021-04-10 DIAGNOSIS — I700 Atherosclerosis of aorta: Secondary | ICD-10-CM | POA: Diagnosis not present

## 2021-04-10 DIAGNOSIS — R451 Restlessness and agitation: Secondary | ICD-10-CM | POA: Diagnosis not present

## 2021-04-10 DIAGNOSIS — S22049A Unspecified fracture of fourth thoracic vertebra, initial encounter for closed fracture: Secondary | ICD-10-CM | POA: Diagnosis not present

## 2021-04-10 DIAGNOSIS — S41112A Laceration without foreign body of left upper arm, initial encounter: Secondary | ICD-10-CM | POA: Diagnosis not present

## 2021-04-10 DIAGNOSIS — E162 Hypoglycemia, unspecified: Secondary | ICD-10-CM | POA: Diagnosis not present

## 2021-04-10 DIAGNOSIS — E039 Hypothyroidism, unspecified: Secondary | ICD-10-CM | POA: Diagnosis not present

## 2021-04-10 DIAGNOSIS — R0902 Hypoxemia: Secondary | ICD-10-CM | POA: Diagnosis not present

## 2021-04-10 DIAGNOSIS — S7011XA Contusion of right thigh, initial encounter: Secondary | ICD-10-CM | POA: Diagnosis not present

## 2021-04-10 DIAGNOSIS — D72829 Elevated white blood cell count, unspecified: Secondary | ICD-10-CM | POA: Diagnosis not present

## 2021-04-10 DIAGNOSIS — S22019A Unspecified fracture of first thoracic vertebra, initial encounter for closed fracture: Secondary | ICD-10-CM | POA: Diagnosis not present

## 2021-04-10 DIAGNOSIS — S32019A Unspecified fracture of first lumbar vertebra, initial encounter for closed fracture: Secondary | ICD-10-CM | POA: Diagnosis not present

## 2021-04-10 DIAGNOSIS — S32040A Wedge compression fracture of fourth lumbar vertebra, initial encounter for closed fracture: Secondary | ICD-10-CM | POA: Diagnosis not present

## 2021-04-10 DIAGNOSIS — S2221XA Fracture of manubrium, initial encounter for closed fracture: Secondary | ICD-10-CM | POA: Diagnosis not present

## 2021-04-10 DIAGNOSIS — R6889 Other general symptoms and signs: Secondary | ICD-10-CM | POA: Diagnosis not present

## 2021-04-10 DIAGNOSIS — Z7951 Long term (current) use of inhaled steroids: Secondary | ICD-10-CM | POA: Diagnosis not present

## 2021-04-10 DIAGNOSIS — E785 Hyperlipidemia, unspecified: Secondary | ICD-10-CM | POA: Diagnosis not present

## 2021-04-10 DIAGNOSIS — T148XXA Other injury of unspecified body region, initial encounter: Secondary | ICD-10-CM | POA: Diagnosis not present

## 2021-04-10 DIAGNOSIS — S8991XA Unspecified injury of right lower leg, initial encounter: Secondary | ICD-10-CM | POA: Diagnosis not present

## 2021-04-10 DIAGNOSIS — M4802 Spinal stenosis, cervical region: Secondary | ICD-10-CM | POA: Diagnosis not present

## 2021-04-10 DIAGNOSIS — E1165 Type 2 diabetes mellitus with hyperglycemia: Secondary | ICD-10-CM | POA: Diagnosis not present

## 2021-04-10 DIAGNOSIS — G8911 Acute pain due to trauma: Secondary | ICD-10-CM | POA: Diagnosis not present

## 2021-04-10 DIAGNOSIS — R58 Hemorrhage, not elsewhere classified: Secondary | ICD-10-CM | POA: Diagnosis not present

## 2021-04-10 DIAGNOSIS — Y999 Unspecified external cause status: Secondary | ICD-10-CM | POA: Diagnosis not present

## 2021-04-10 DIAGNOSIS — S32012A Unstable burst fracture of first lumbar vertebra, initial encounter for closed fracture: Secondary | ICD-10-CM | POA: Diagnosis not present

## 2021-04-10 DIAGNOSIS — S6991XA Unspecified injury of right wrist, hand and finger(s), initial encounter: Secondary | ICD-10-CM | POA: Diagnosis not present

## 2021-04-10 DIAGNOSIS — L918 Other hypertrophic disorders of the skin: Secondary | ICD-10-CM | POA: Diagnosis not present

## 2021-04-10 DIAGNOSIS — S12300A Unspecified displaced fracture of fourth cervical vertebra, initial encounter for closed fracture: Secondary | ICD-10-CM | POA: Diagnosis not present

## 2021-04-10 DIAGNOSIS — Z9181 History of falling: Secondary | ICD-10-CM | POA: Diagnosis not present

## 2021-04-10 DIAGNOSIS — R0689 Other abnormalities of breathing: Secondary | ICD-10-CM | POA: Diagnosis not present

## 2021-04-10 DIAGNOSIS — E119 Type 2 diabetes mellitus without complications: Secondary | ICD-10-CM | POA: Diagnosis not present

## 2021-04-10 DIAGNOSIS — J449 Chronic obstructive pulmonary disease, unspecified: Secondary | ICD-10-CM | POA: Diagnosis not present

## 2021-04-10 DIAGNOSIS — I110 Hypertensive heart disease with heart failure: Secondary | ICD-10-CM | POA: Diagnosis not present

## 2021-04-10 DIAGNOSIS — E11649 Type 2 diabetes mellitus with hypoglycemia without coma: Secondary | ICD-10-CM | POA: Diagnosis not present

## 2021-04-10 DIAGNOSIS — R0603 Acute respiratory distress: Secondary | ICD-10-CM | POA: Diagnosis not present

## 2021-04-10 DIAGNOSIS — G4733 Obstructive sleep apnea (adult) (pediatric): Secondary | ICD-10-CM | POA: Diagnosis not present

## 2021-04-10 DIAGNOSIS — M47812 Spondylosis without myelopathy or radiculopathy, cervical region: Secondary | ICD-10-CM | POA: Diagnosis not present

## 2021-04-10 DIAGNOSIS — I509 Heart failure, unspecified: Secondary | ICD-10-CM | POA: Diagnosis not present

## 2021-04-10 DIAGNOSIS — Z7189 Other specified counseling: Secondary | ICD-10-CM | POA: Diagnosis not present

## 2021-04-10 DIAGNOSIS — S8992XA Unspecified injury of left lower leg, initial encounter: Secondary | ICD-10-CM | POA: Diagnosis not present

## 2021-04-10 DIAGNOSIS — S59901A Unspecified injury of right elbow, initial encounter: Secondary | ICD-10-CM | POA: Diagnosis not present

## 2021-04-22 DIAGNOSIS — R5381 Other malaise: Secondary | ICD-10-CM | POA: Diagnosis not present

## 2021-04-22 DIAGNOSIS — E039 Hypothyroidism, unspecified: Secondary | ICD-10-CM | POA: Diagnosis not present

## 2021-04-22 DIAGNOSIS — J449 Chronic obstructive pulmonary disease, unspecified: Secondary | ICD-10-CM | POA: Diagnosis not present

## 2021-04-22 DIAGNOSIS — E118 Type 2 diabetes mellitus with unspecified complications: Secondary | ICD-10-CM | POA: Diagnosis not present

## 2021-04-22 DIAGNOSIS — M4807 Spinal stenosis, lumbosacral region: Secondary | ICD-10-CM | POA: Diagnosis not present

## 2021-04-22 DIAGNOSIS — M6281 Muscle weakness (generalized): Secondary | ICD-10-CM | POA: Diagnosis not present

## 2021-04-30 DIAGNOSIS — M4850XA Collapsed vertebra, not elsewhere classified, site unspecified, initial encounter for fracture: Secondary | ICD-10-CM | POA: Diagnosis not present

## 2021-05-09 DIAGNOSIS — S12301D Unspecified nondisplaced fracture of fourth cervical vertebra, subsequent encounter for fracture with routine healing: Secondary | ICD-10-CM | POA: Diagnosis not present

## 2021-05-09 DIAGNOSIS — M47812 Spondylosis without myelopathy or radiculopathy, cervical region: Secondary | ICD-10-CM | POA: Diagnosis not present

## 2021-05-09 DIAGNOSIS — S32011D Stable burst fracture of first lumbar vertebra, subsequent encounter for fracture with routine healing: Secondary | ICD-10-CM | POA: Diagnosis not present

## 2021-05-09 DIAGNOSIS — S32018D Other fracture of first lumbar vertebra, subsequent encounter for fracture with routine healing: Secondary | ICD-10-CM | POA: Diagnosis not present

## 2021-05-09 DIAGNOSIS — M5030 Other cervical disc degeneration, unspecified cervical region: Secondary | ICD-10-CM | POA: Diagnosis not present

## 2021-05-09 DIAGNOSIS — M4802 Spinal stenosis, cervical region: Secondary | ICD-10-CM | POA: Diagnosis not present

## 2021-05-09 DIAGNOSIS — S12391D Other nondisplaced fracture of fourth cervical vertebra, subsequent encounter for fracture with routine healing: Secondary | ICD-10-CM | POA: Diagnosis not present

## 2021-05-09 DIAGNOSIS — S12300D Unspecified displaced fracture of fourth cervical vertebra, subsequent encounter for fracture with routine healing: Secondary | ICD-10-CM | POA: Diagnosis not present

## 2021-05-09 DIAGNOSIS — M4312 Spondylolisthesis, cervical region: Secondary | ICD-10-CM | POA: Diagnosis not present

## 2021-05-31 DIAGNOSIS — J45901 Unspecified asthma with (acute) exacerbation: Secondary | ICD-10-CM | POA: Diagnosis not present

## 2021-05-31 DIAGNOSIS — J45909 Unspecified asthma, uncomplicated: Secondary | ICD-10-CM | POA: Diagnosis not present

## 2021-05-31 DIAGNOSIS — J4541 Moderate persistent asthma with (acute) exacerbation: Secondary | ICD-10-CM | POA: Diagnosis not present

## 2021-05-31 DIAGNOSIS — E782 Mixed hyperlipidemia: Secondary | ICD-10-CM | POA: Diagnosis not present

## 2021-05-31 DIAGNOSIS — G4700 Insomnia, unspecified: Secondary | ICD-10-CM | POA: Diagnosis not present

## 2021-05-31 DIAGNOSIS — E1140 Type 2 diabetes mellitus with diabetic neuropathy, unspecified: Secondary | ICD-10-CM | POA: Diagnosis not present

## 2021-05-31 DIAGNOSIS — E039 Hypothyroidism, unspecified: Secondary | ICD-10-CM | POA: Diagnosis not present

## 2021-05-31 DIAGNOSIS — J449 Chronic obstructive pulmonary disease, unspecified: Secondary | ICD-10-CM | POA: Diagnosis not present

## 2021-06-11 DIAGNOSIS — G4733 Obstructive sleep apnea (adult) (pediatric): Secondary | ICD-10-CM | POA: Diagnosis not present

## 2021-06-13 DIAGNOSIS — R29890 Loss of height: Secondary | ICD-10-CM | POA: Diagnosis not present

## 2021-06-13 DIAGNOSIS — S32011D Stable burst fracture of first lumbar vertebra, subsequent encounter for fracture with routine healing: Secondary | ICD-10-CM | POA: Diagnosis not present

## 2021-06-13 DIAGNOSIS — M438X6 Other specified deforming dorsopathies, lumbar region: Secondary | ICD-10-CM | POA: Diagnosis not present

## 2021-06-13 DIAGNOSIS — M8588 Other specified disorders of bone density and structure, other site: Secondary | ICD-10-CM | POA: Diagnosis not present

## 2021-06-13 DIAGNOSIS — S32048D Other fracture of fourth lumbar vertebra, subsequent encounter for fracture with routine healing: Secondary | ICD-10-CM | POA: Diagnosis not present

## 2021-06-13 DIAGNOSIS — M4312 Spondylolisthesis, cervical region: Secondary | ICD-10-CM | POA: Diagnosis not present

## 2021-06-13 DIAGNOSIS — S12301D Unspecified nondisplaced fracture of fourth cervical vertebra, subsequent encounter for fracture with routine healing: Secondary | ICD-10-CM | POA: Diagnosis not present

## 2021-06-13 DIAGNOSIS — M5136 Other intervertebral disc degeneration, lumbar region: Secondary | ICD-10-CM | POA: Diagnosis not present

## 2021-06-29 ENCOUNTER — Other Ambulatory Visit: Payer: Self-pay

## 2021-06-29 ENCOUNTER — Emergency Department (HOSPITAL_COMMUNITY)
Admission: EM | Admit: 2021-06-29 | Discharge: 2021-06-29 | Disposition: A | Discharge status: Home / Self Care | Payer: Medicare Other | Attending: Emergency Medicine | Admitting: Emergency Medicine

## 2021-06-29 ENCOUNTER — Encounter (HOSPITAL_COMMUNITY): Payer: Self-pay | Admitting: Emergency Medicine

## 2021-06-29 DIAGNOSIS — E782 Mixed hyperlipidemia: Secondary | ICD-10-CM | POA: Diagnosis not present

## 2021-06-29 DIAGNOSIS — Z794 Long term (current) use of insulin: Secondary | ICD-10-CM | POA: Diagnosis not present

## 2021-06-29 DIAGNOSIS — J45909 Unspecified asthma, uncomplicated: Secondary | ICD-10-CM | POA: Diagnosis not present

## 2021-06-29 DIAGNOSIS — R6889 Other general symptoms and signs: Secondary | ICD-10-CM | POA: Diagnosis not present

## 2021-06-29 DIAGNOSIS — E161 Other hypoglycemia: Secondary | ICD-10-CM | POA: Diagnosis not present

## 2021-06-29 DIAGNOSIS — I499 Cardiac arrhythmia, unspecified: Secondary | ICD-10-CM | POA: Diagnosis not present

## 2021-06-29 DIAGNOSIS — E162 Hypoglycemia, unspecified: Secondary | ICD-10-CM

## 2021-06-29 DIAGNOSIS — R404 Transient alteration of awareness: Secondary | ICD-10-CM | POA: Diagnosis not present

## 2021-06-29 DIAGNOSIS — J4541 Moderate persistent asthma with (acute) exacerbation: Secondary | ICD-10-CM | POA: Diagnosis not present

## 2021-06-29 DIAGNOSIS — I10 Essential (primary) hypertension: Secondary | ICD-10-CM | POA: Diagnosis not present

## 2021-06-29 DIAGNOSIS — Z743 Need for continuous supervision: Secondary | ICD-10-CM | POA: Diagnosis not present

## 2021-06-29 DIAGNOSIS — E11649 Type 2 diabetes mellitus with hypoglycemia without coma: Secondary | ICD-10-CM | POA: Insufficient documentation

## 2021-06-29 DIAGNOSIS — E039 Hypothyroidism, unspecified: Secondary | ICD-10-CM | POA: Insufficient documentation

## 2021-06-29 DIAGNOSIS — J45901 Unspecified asthma with (acute) exacerbation: Secondary | ICD-10-CM | POA: Diagnosis not present

## 2021-06-29 DIAGNOSIS — J449 Chronic obstructive pulmonary disease, unspecified: Secondary | ICD-10-CM | POA: Diagnosis not present

## 2021-06-29 DIAGNOSIS — G4700 Insomnia, unspecified: Secondary | ICD-10-CM | POA: Diagnosis not present

## 2021-06-29 DIAGNOSIS — E1140 Type 2 diabetes mellitus with diabetic neuropathy, unspecified: Secondary | ICD-10-CM | POA: Diagnosis not present

## 2021-06-29 LAB — CBC WITH DIFFERENTIAL/PLATELET
Abs Immature Granulocytes: 0.07 10*3/uL (ref 0.00–0.07)
Basophils Absolute: 0.1 10*3/uL (ref 0.0–0.1)
Basophils Relative: 0 %
Eosinophils Absolute: 0.2 10*3/uL (ref 0.0–0.5)
Eosinophils Relative: 1 %
HCT: 40.4 % (ref 39.0–52.0)
Hemoglobin: 13.1 g/dL (ref 13.0–17.0)
Immature Granulocytes: 0 %
Lymphocytes Relative: 8 %
Lymphs Abs: 1.3 10*3/uL (ref 0.7–4.0)
MCH: 32.6 pg (ref 26.0–34.0)
MCHC: 32.4 g/dL (ref 30.0–36.0)
MCV: 100.5 fL — ABNORMAL HIGH (ref 80.0–100.0)
Monocytes Absolute: 1.2 10*3/uL — ABNORMAL HIGH (ref 0.1–1.0)
Monocytes Relative: 7 %
Neutro Abs: 13.1 10*3/uL — ABNORMAL HIGH (ref 1.7–7.7)
Neutrophils Relative %: 84 %
Platelets: 285 10*3/uL (ref 150–400)
RBC: 4.02 MIL/uL — ABNORMAL LOW (ref 4.22–5.81)
RDW: 13.2 % (ref 11.5–15.5)
WBC: 15.9 10*3/uL — ABNORMAL HIGH (ref 4.0–10.5)
nRBC: 0 % (ref 0.0–0.2)

## 2021-06-29 LAB — COMPREHENSIVE METABOLIC PANEL
ALT: 36 U/L (ref 0–44)
AST: 33 U/L (ref 15–41)
Albumin: 3.9 g/dL (ref 3.5–5.0)
Alkaline Phosphatase: 85 U/L (ref 38–126)
Anion gap: 6 (ref 5–15)
BUN: 20 mg/dL (ref 8–23)
CO2: 29 mmol/L (ref 22–32)
Calcium: 8.7 mg/dL — ABNORMAL LOW (ref 8.9–10.3)
Chloride: 104 mmol/L (ref 98–111)
Creatinine, Ser: 0.78 mg/dL (ref 0.61–1.24)
GFR, Estimated: >60 mL/min (ref >60)
Glucose, Bld: 71 mg/dL (ref 70–99)
Potassium: 3.3 mmol/L — ABNORMAL LOW (ref 3.5–5.1)
Sodium: 139 mmol/L (ref 135–145)
Total Bilirubin: 0.7 mg/dL (ref 0.3–1.2)
Total Protein: 6.6 g/dL (ref 6.5–8.1)

## 2021-06-29 LAB — CBG MONITORING, ED
Glucose-Capillary: 42 mg/dL — CL (ref 70–99)
Glucose-Capillary: 62 mg/dL — ABNORMAL LOW (ref 70–99)
Glucose-Capillary: 70 mg/dL (ref 70–99)
Glucose-Capillary: 73 mg/dL (ref 70–99)
Glucose-Capillary: 100 mg/dL — ABNORMAL HIGH (ref 70–99)

## 2021-06-29 LAB — URINALYSIS, ROUTINE W REFLEX MICROSCOPIC
Bilirubin Urine: NEGATIVE
Glucose, UA: NEGATIVE mg/dL
Hgb urine dipstick: NEGATIVE
Ketones, ur: NEGATIVE mg/dL
Leukocytes,Ua: NEGATIVE
Nitrite: NEGATIVE
Protein, ur: NEGATIVE mg/dL
Specific Gravity, Urine: 1.015 (ref 1.005–1.030)
pH: 6 (ref 5.0–8.0)

## 2021-06-29 MED ORDER — DEXTROSE 50 % IV SOLN
INTRAVENOUS | Status: AC
  Administered 2021-06-29: 50 mL via INTRAVENOUS
  Filled 2021-06-29: qty 50

## 2021-06-29 MED ORDER — DEXTROSE 50 % IV SOLN: 1.0000 | Freq: Once | INTRAVENOUS | Status: AC

## 2021-06-29 MED ORDER — ACETAMINOPHEN 500 MG PO TABS
1000.0000 mg | ORAL_TABLET | Freq: Once | ORAL | Status: AC
  Administered 2021-06-29: 1000 mg via ORAL
  Filled 2021-06-29: qty 2

## 2021-06-29 NOTE — ED Triage Notes (Addendum)
Pt to the ED with hypoglycemia. Pt was found by ems to be unresponsive, CBG of 38, with snoring respirations.  EMS gave the pt glucagon and an amp of D10.  Pt fell half way out of bed and cut his right hand and left elbow.

## 2021-06-29 NOTE — ED Provider Notes (Addendum)
[facility] EMERGENCY DEPARTMENT Provider Note   CSN: [id number] Arrival date & time: 06/29/21  0755     History  Chief Complaint  Patient presents with   Hypoglycemia    Eduardo Long is a 66 y.o. male.  HPI  66 year old male with past medical history of insulin-dependent diabetes, HTN, HLD, hypothyroidism presents emergency department after being found altered.  Patient states he took his insulin last night.  He did not eat a large amount after that, went to bed.  Early this morning he got up to go to the bathroom and his wife noticed that he seemed off from his baseline.  He became unresponsive and ambulance was called.  He was noted to be hypoglycemic, given D50 in the field with improvement.  Patient states that this is happened before, ambulance has had to come to his house before to give him blood sugar.  He is had no recent dosage changes in his insulin.  Denies any recent illness.  States that he feels back to baseline currently.  Home Medications Prior to Admission medications   Medication Sig Start Date End Date Taking? Authorizing Provider  albuterol (PROAIR HFA) 108 (90 BASE) MCG/ACT inhaler Inhale 2 puffs into the lungs every 6 (six) hours as needed for wheezing or shortness of breath.    [provider]  albuterol (PROVENTIL) (2.5 MG/3ML) 0.083% nebulizer solution Take 2.5 mg by nebulization every 6 (six) hours as needed for wheezing or shortness of breath.    [provider]  atorvastatin (LIPITOR) 40 MG tablet Take 40 mg by mouth daily.    [provider]  Budesonide-Formoterol Fumarate (SYMBICORT IN) Inhale into the lungs in the morning and at bedtime.     [provider]  furosemide (LASIX) 40 MG tablet Take 1 tablet (40 mg total) by mouth daily. Patient taking differently: Take 80 mg by mouth daily.  04/13/15   Orson Eva, MD  HUMALOG KWIKPEN 100 UNIT/ML KwikPen Sliding scale. 09/30/19   [provider]  Insulin Detemir  (LEVEMIR) 100 UNIT/ML Pen Inject 22 Units into the skin 2 (two) times daily. Patient taking differently: Inject 70 Units into the skin 2 (two) times daily.  04/13/15   Orson Eva, MD  Insulin NPH Human, Isophane, (HUMULIN N [place]) Inject into the skin.    [provider]  Insulin Pen Needle 32G X 4 MM MISC Use with insulin pens to dispense insulin 04/13/15   Tat, David, MD  ipratropium (ATROVENT HFA) 17 MCG/ACT inhaler Inhale 2 puffs into the lungs every 6 (six) hours as needed for wheezing. 05/09/19   Milton Ferguson, MD  KLOR-CON M20 20 MEQ tablet Take 20 mEq by mouth daily. 06/17/15   [provider]  levothyroxine (SYNTHROID, LEVOTHROID) 200 MCG tablet Take 200 mcg by mouth daily before breakfast.    [provider]  metoprolol succinate (TOPROL-XL) 50 MG 24 hr tablet Take 1 tablet (50 mg total) by mouth daily. Take with or immediately following a meal. 10/11/19 01/09/20  Herminio Commons, MD  montelukast (SINGULAIR) 10 MG tablet Take 1 tablet (10 mg total) by mouth daily. 11/15/19   Julian Hy, DO  Multiple Vitamin (MULTIVITAMIN) tablet Take 1 tablet by mouth daily.    [provider]  [facility] VERIO test strip 3 (three) times daily. for testing 11/09/19   [provider]  TRULICITY 6.72 CN/4.7SJ SOPN SMARTSIG:0.5 Milliliter(s) SUB-Q Once a Week 11/08/19   [provider]  zolpidem (AMBIEN) 10 MG tablet  Take 10 mg by mouth at bedtime as needed for sleep.  03/21/15   [provider]      Allergies    Patient has no known allergies.    Review of Systems   Review of Systems  Constitutional:  Positive for fatigue. Negative for fever.  Respiratory:  Negative for shortness of breath.   Cardiovascular:  Negative for chest pain.  Gastrointestinal:  Negative for abdominal pain, diarrhea, nausea and vomiting.  Genitourinary:  Negative for difficulty urinating.  Skin:  Negative for rash.  Neurological:  Negative for headaches.    Physical Exam Updated Vital Signs BP (!) 102/57    Pulse 78    Temp 98.8 F (37.1 C) (Oral)    Resp 19    Ht 5\' 10"  (1.778 m)    Wt 129.2 kg    SpO2 99%    BMI 40.87 kg/m  Physical Exam Vitals and nursing note reviewed.  Constitutional:      Appearance: Normal appearance.  HENT:     Head: Normocephalic.     Mouth/Throat:     Mouth: Mucous membranes are moist.  Cardiovascular:     Rate and Rhythm: Normal rate.  Pulmonary:     Effort: Pulmonary effort is normal. No respiratory distress.  Abdominal:     Palpations: Abdomen is soft.     Tenderness: There is no abdominal tenderness.  Skin:    General: Skin is warm.  Neurological:     Mental Status: He is alert and oriented to person, place, and time. Mental status is at baseline.  Psychiatric:        Mood and Affect: Mood normal.    ED Results / Procedures / Treatments   Labs (all labs ordered are listed, but only abnormal results are displayed) Labs Reviewed  CBC WITH DIFFERENTIAL/PLATELET - Abnormal; Notable for the following components:      Result Value   WBC 15.9 (*)    RBC 4.02 (*)    MCV 100.5 (*)    Neutro Abs 13.1 (*)    Monocytes Absolute 1.2 (*)    All other components within normal limits  CBG MONITORING, ED - Abnormal; Notable for the following components:   Glucose-Capillary 62 (*)    All other components within normal limits  CBG MONITORING, ED - Abnormal; Notable for the following components:   Glucose-Capillary 42 (*)    All other components within normal limits  COMPREHENSIVE METABOLIC PANEL  URINALYSIS, ROUTINE W REFLEX MICROSCOPIC  CBG MONITORING, ED  CBG MONITORING, ED    EKG EKG Interpretation  Date/Time:  Sunday June 29 2021 08:02:45 EST Ventricular Rate:  87 PR Interval:  155 QRS Duration: 172 QT Interval:  429 QTC Calculation: 517 R Axis:   -25 Text Interpretation: Sinus rhythm Probable left atrial enlargement Right bundle branch block Artifact, RBBB baseline Confirmed by  Lavenia Atlas [phone]) on 06/29/2021 8:26:19 AM  Radiology No results found.  Procedures .Critical Care Performed by: Lorelle Gibbs, DO Authorized by: Lorelle Gibbs, DO   Critical care provider statement:    Critical care time (minutes):  30   Critical care time was exclusive of:  Separately billable procedures and treating other patients   Critical care was necessary to treat or prevent imminent or life-threatening deterioration of the following conditions:  Endocrine crisis   Critical care was time spent personally by me on the following activities:  Development of treatment plan with patient or surrogate, evaluation of patient's response to treatment,  examination of patient, ordering and performing treatments and interventions, re-evaluation of patient's condition, review of old charts and obtaining history from patient or surrogate   I assumed direction of critical care for this patient from another provider in my specialty: no      Medications Ordered in ED Medications  dextrose 50 % solution 50 mL (50 mLs Intravenous Given 06/29/21 0830)    ED Course/ Medical Decision Making/ A&P                           Medical Decision Making Amount and/or Complexity of Data Reviewed Labs: ordered.  Risk OTC drugs. Prescription drug management.   66 year old male presents emergency department after an episode of hypoglycemia at home.  Was noted to be altered, ambulance was called, was given D50 in route.  On arrival the blood sugar still low and he was given an extra dose of D50 and then allowed to eat and drink.  Patient states that his blood sugar sensor died overnight.  This usually signals to him when his blood sugar is low, he never got that signal which is possibly how it progressed so low that he became altered.  He is had this issue in the past.  Denies any acute illness or current complaints.  States he feels well and back to baseline.  Has been no recent change in his  insulin regimen.  He admits to taking his dose last night and not eating a small meal afterwards.  The wife is now at bedside and states that this is happened in the past.  They have an appointment tomorrow with her endocrinologist for repeat blood work and adjustments.  Work-up here is otherwise been reassuring.  Patient's been allowed to eat and drink and is blood sugar has stabilized.  He was monitored for 5 hours with no recurrent hypoglycemia.  Plan for outpatient follow-up with his endocrinologist tomorrow morning at the appointment.  Patient at this time appears safe and stable for discharge and close outpatient follow up. Discharge plan and strict return to ED precautions discussed, patient verbalizes understanding and agreement.        Final Clinical Impression(s) / ED Diagnoses Final diagnoses:  None    Rx / DC Orders ED Discharge Orders     None         Lorelle Gibbs, DO 06/29/21 1252    Brennan Karam, Alvin Critchley, DO 06/29/21 1252

## 2021-06-29 NOTE — ED Notes (Signed)
Pt was provided with 4 more ounces of orange juice.

## 2021-06-29 NOTE — ED Notes (Signed)
Pt was provided with orange juice, and peanut butter graham crackers.

## 2021-06-29 NOTE — Discharge Instructions (Signed)
You have been seen and discharged from the emergency department.  It is very important that you follow-up with your endocrinologist tomorrow so they can evaluate these episodes of hypoglycemia and adjust medications appropriately.  Eat small frequent meals today to avoid any hypoglycemia.  Follow-up with your primary provider for further evaluation and further care. Take home medications as prescribed. If you have any worsening symptoms or further concerns for your health please return to an emergency department for further evaluation.

## 2021-06-30 DIAGNOSIS — E039 Hypothyroidism, unspecified: Secondary | ICD-10-CM | POA: Diagnosis not present

## 2021-06-30 DIAGNOSIS — E1165 Type 2 diabetes mellitus with hyperglycemia: Secondary | ICD-10-CM | POA: Diagnosis not present

## 2021-06-30 DIAGNOSIS — E7800 Pure hypercholesterolemia, unspecified: Secondary | ICD-10-CM | POA: Diagnosis not present

## 2021-07-01 DIAGNOSIS — M79671 Pain in right foot: Secondary | ICD-10-CM | POA: Diagnosis not present

## 2021-07-01 DIAGNOSIS — E1140 Type 2 diabetes mellitus with diabetic neuropathy, unspecified: Secondary | ICD-10-CM | POA: Diagnosis not present

## 2021-07-01 DIAGNOSIS — I739 Peripheral vascular disease, unspecified: Secondary | ICD-10-CM | POA: Diagnosis not present

## 2021-07-01 DIAGNOSIS — M79672 Pain in left foot: Secondary | ICD-10-CM | POA: Diagnosis not present

## 2021-07-07 DIAGNOSIS — Z794 Long term (current) use of insulin: Secondary | ICD-10-CM | POA: Diagnosis not present

## 2021-07-07 DIAGNOSIS — E039 Hypothyroidism, unspecified: Secondary | ICD-10-CM | POA: Diagnosis not present

## 2021-07-07 DIAGNOSIS — E1165 Type 2 diabetes mellitus with hyperglycemia: Secondary | ICD-10-CM | POA: Diagnosis not present

## 2021-07-07 DIAGNOSIS — J45909 Unspecified asthma, uncomplicated: Secondary | ICD-10-CM | POA: Diagnosis not present

## 2021-07-07 DIAGNOSIS — I10 Essential (primary) hypertension: Secondary | ICD-10-CM | POA: Diagnosis not present

## 2021-07-07 DIAGNOSIS — E7800 Pure hypercholesterolemia, unspecified: Secondary | ICD-10-CM | POA: Diagnosis not present

## 2021-07-14 DIAGNOSIS — E039 Hypothyroidism, unspecified: Secondary | ICD-10-CM | POA: Diagnosis not present

## 2021-07-14 DIAGNOSIS — Z23 Encounter for immunization: Secondary | ICD-10-CM | POA: Diagnosis not present

## 2021-07-14 DIAGNOSIS — G4700 Insomnia, unspecified: Secondary | ICD-10-CM | POA: Diagnosis not present

## 2021-07-14 DIAGNOSIS — J45909 Unspecified asthma, uncomplicated: Secondary | ICD-10-CM | POA: Diagnosis not present

## 2021-07-14 DIAGNOSIS — I10 Essential (primary) hypertension: Secondary | ICD-10-CM | POA: Diagnosis not present

## 2021-07-14 DIAGNOSIS — I2510 Atherosclerotic heart disease of native coronary artery without angina pectoris: Secondary | ICD-10-CM | POA: Diagnosis not present

## 2021-07-14 DIAGNOSIS — E782 Mixed hyperlipidemia: Secondary | ICD-10-CM | POA: Diagnosis not present

## 2021-07-14 DIAGNOSIS — E1159 Type 2 diabetes mellitus with other circulatory complications: Secondary | ICD-10-CM | POA: Diagnosis not present

## 2021-08-18 DIAGNOSIS — G4700 Insomnia, unspecified: Secondary | ICD-10-CM | POA: Diagnosis not present

## 2021-08-18 DIAGNOSIS — J45909 Unspecified asthma, uncomplicated: Secondary | ICD-10-CM | POA: Diagnosis not present

## 2021-08-18 DIAGNOSIS — E039 Hypothyroidism, unspecified: Secondary | ICD-10-CM | POA: Diagnosis not present

## 2021-08-18 DIAGNOSIS — E782 Mixed hyperlipidemia: Secondary | ICD-10-CM | POA: Diagnosis not present

## 2021-08-18 DIAGNOSIS — E1140 Type 2 diabetes mellitus with diabetic neuropathy, unspecified: Secondary | ICD-10-CM | POA: Diagnosis not present

## 2021-08-18 DIAGNOSIS — I10 Essential (primary) hypertension: Secondary | ICD-10-CM | POA: Diagnosis not present

## 2021-09-16 DIAGNOSIS — M79671 Pain in right foot: Secondary | ICD-10-CM | POA: Diagnosis not present

## 2021-09-16 DIAGNOSIS — M79672 Pain in left foot: Secondary | ICD-10-CM | POA: Diagnosis not present

## 2021-09-16 DIAGNOSIS — I739 Peripheral vascular disease, unspecified: Secondary | ICD-10-CM | POA: Diagnosis not present

## 2021-09-16 DIAGNOSIS — E1140 Type 2 diabetes mellitus with diabetic neuropathy, unspecified: Secondary | ICD-10-CM | POA: Diagnosis not present

## 2021-09-20 DIAGNOSIS — G4700 Insomnia, unspecified: Secondary | ICD-10-CM | POA: Diagnosis not present

## 2021-09-20 DIAGNOSIS — E782 Mixed hyperlipidemia: Secondary | ICD-10-CM | POA: Diagnosis not present

## 2021-09-20 DIAGNOSIS — E1140 Type 2 diabetes mellitus with diabetic neuropathy, unspecified: Secondary | ICD-10-CM | POA: Diagnosis not present

## 2021-09-20 DIAGNOSIS — J45909 Unspecified asthma, uncomplicated: Secondary | ICD-10-CM | POA: Diagnosis not present

## 2021-09-20 DIAGNOSIS — I10 Essential (primary) hypertension: Secondary | ICD-10-CM | POA: Diagnosis not present

## 2021-09-24 DIAGNOSIS — G4733 Obstructive sleep apnea (adult) (pediatric): Secondary | ICD-10-CM | POA: Diagnosis not present

## 2021-09-30 DIAGNOSIS — E782 Mixed hyperlipidemia: Secondary | ICD-10-CM | POA: Diagnosis not present

## 2021-09-30 DIAGNOSIS — E1140 Type 2 diabetes mellitus with diabetic neuropathy, unspecified: Secondary | ICD-10-CM | POA: Diagnosis not present

## 2021-09-30 DIAGNOSIS — I10 Essential (primary) hypertension: Secondary | ICD-10-CM | POA: Diagnosis not present

## 2021-09-30 DIAGNOSIS — J45909 Unspecified asthma, uncomplicated: Secondary | ICD-10-CM | POA: Diagnosis not present

## 2021-09-30 DIAGNOSIS — G4700 Insomnia, unspecified: Secondary | ICD-10-CM | POA: Diagnosis not present

## 2021-09-30 DIAGNOSIS — E039 Hypothyroidism, unspecified: Secondary | ICD-10-CM | POA: Diagnosis not present

## 2021-10-03 DIAGNOSIS — Z794 Long term (current) use of insulin: Secondary | ICD-10-CM | POA: Diagnosis not present

## 2021-10-03 DIAGNOSIS — E1165 Type 2 diabetes mellitus with hyperglycemia: Secondary | ICD-10-CM | POA: Diagnosis not present

## 2021-10-25 DIAGNOSIS — G4733 Obstructive sleep apnea (adult) (pediatric): Secondary | ICD-10-CM | POA: Diagnosis not present

## 2021-11-03 DIAGNOSIS — E1165 Type 2 diabetes mellitus with hyperglycemia: Secondary | ICD-10-CM | POA: Diagnosis not present

## 2021-11-03 DIAGNOSIS — Z794 Long term (current) use of insulin: Secondary | ICD-10-CM | POA: Diagnosis not present

## 2021-11-04 DIAGNOSIS — J45909 Unspecified asthma, uncomplicated: Secondary | ICD-10-CM | POA: Diagnosis not present

## 2021-11-04 DIAGNOSIS — E7800 Pure hypercholesterolemia, unspecified: Secondary | ICD-10-CM | POA: Diagnosis not present

## 2021-11-04 DIAGNOSIS — E1165 Type 2 diabetes mellitus with hyperglycemia: Secondary | ICD-10-CM | POA: Diagnosis not present

## 2021-11-04 DIAGNOSIS — E039 Hypothyroidism, unspecified: Secondary | ICD-10-CM | POA: Diagnosis not present

## 2021-11-04 DIAGNOSIS — I10 Essential (primary) hypertension: Secondary | ICD-10-CM | POA: Diagnosis not present

## 2021-11-25 DIAGNOSIS — I739 Peripheral vascular disease, unspecified: Secondary | ICD-10-CM | POA: Diagnosis not present

## 2021-11-25 DIAGNOSIS — M79672 Pain in left foot: Secondary | ICD-10-CM | POA: Diagnosis not present

## 2021-11-25 DIAGNOSIS — E1140 Type 2 diabetes mellitus with diabetic neuropathy, unspecified: Secondary | ICD-10-CM | POA: Diagnosis not present

## 2021-11-25 DIAGNOSIS — M79671 Pain in right foot: Secondary | ICD-10-CM | POA: Diagnosis not present

## 2021-12-04 DIAGNOSIS — Z794 Long term (current) use of insulin: Secondary | ICD-10-CM | POA: Diagnosis not present

## 2021-12-04 DIAGNOSIS — E1165 Type 2 diabetes mellitus with hyperglycemia: Secondary | ICD-10-CM | POA: Diagnosis not present

## 2021-12-26 DIAGNOSIS — E1140 Type 2 diabetes mellitus with diabetic neuropathy, unspecified: Secondary | ICD-10-CM | POA: Diagnosis not present

## 2021-12-26 DIAGNOSIS — E782 Mixed hyperlipidemia: Secondary | ICD-10-CM | POA: Diagnosis not present

## 2021-12-26 DIAGNOSIS — E039 Hypothyroidism, unspecified: Secondary | ICD-10-CM | POA: Diagnosis not present

## 2021-12-26 DIAGNOSIS — J45909 Unspecified asthma, uncomplicated: Secondary | ICD-10-CM | POA: Diagnosis not present

## 2021-12-30 DIAGNOSIS — E1165 Type 2 diabetes mellitus with hyperglycemia: Secondary | ICD-10-CM | POA: Diagnosis not present

## 2021-12-30 DIAGNOSIS — Z794 Long term (current) use of insulin: Secondary | ICD-10-CM | POA: Diagnosis not present

## 2022-01-13 DIAGNOSIS — I10 Essential (primary) hypertension: Secondary | ICD-10-CM | POA: Diagnosis not present

## 2022-01-13 DIAGNOSIS — G4700 Insomnia, unspecified: Secondary | ICD-10-CM | POA: Diagnosis not present

## 2022-01-13 DIAGNOSIS — E782 Mixed hyperlipidemia: Secondary | ICD-10-CM | POA: Diagnosis not present

## 2022-01-13 DIAGNOSIS — I2510 Atherosclerotic heart disease of native coronary artery without angina pectoris: Secondary | ICD-10-CM | POA: Diagnosis not present

## 2022-01-13 DIAGNOSIS — E039 Hypothyroidism, unspecified: Secondary | ICD-10-CM | POA: Diagnosis not present

## 2022-01-13 DIAGNOSIS — J45909 Unspecified asthma, uncomplicated: Secondary | ICD-10-CM | POA: Diagnosis not present

## 2022-01-13 DIAGNOSIS — Z0000 Encounter for general adult medical examination without abnormal findings: Secondary | ICD-10-CM | POA: Diagnosis not present

## 2022-01-13 DIAGNOSIS — D692 Other nonthrombocytopenic purpura: Secondary | ICD-10-CM | POA: Diagnosis not present

## 2022-01-13 DIAGNOSIS — J449 Chronic obstructive pulmonary disease, unspecified: Secondary | ICD-10-CM | POA: Diagnosis not present

## 2022-01-13 DIAGNOSIS — E1140 Type 2 diabetes mellitus with diabetic neuropathy, unspecified: Secondary | ICD-10-CM | POA: Diagnosis not present

## 2022-01-20 DIAGNOSIS — E162 Hypoglycemia, unspecified: Secondary | ICD-10-CM | POA: Diagnosis not present

## 2022-01-20 DIAGNOSIS — R404 Transient alteration of awareness: Secondary | ICD-10-CM | POA: Diagnosis not present

## 2022-01-20 DIAGNOSIS — R61 Generalized hyperhidrosis: Secondary | ICD-10-CM | POA: Diagnosis not present

## 2022-02-03 DIAGNOSIS — E1140 Type 2 diabetes mellitus with diabetic neuropathy, unspecified: Secondary | ICD-10-CM | POA: Diagnosis not present

## 2022-02-03 DIAGNOSIS — M79672 Pain in left foot: Secondary | ICD-10-CM | POA: Diagnosis not present

## 2022-02-03 DIAGNOSIS — I739 Peripheral vascular disease, unspecified: Secondary | ICD-10-CM | POA: Diagnosis not present

## 2022-02-03 DIAGNOSIS — M79671 Pain in right foot: Secondary | ICD-10-CM | POA: Diagnosis not present

## 2022-02-26 DIAGNOSIS — G4733 Obstructive sleep apnea (adult) (pediatric): Secondary | ICD-10-CM | POA: Diagnosis not present

## 2022-03-14 IMAGING — CT CT HEART MORP W/ CTA COR W/ SCORE W/ CA W/CM &/OR W/O CM
4 of 7 series · 8 of 20 positions shown, 9 images · non-contrast
Comparison: None.

Addendum:
CLINICAL DATA: 63 year old with chest pain
TECHNIQUE: The patient was scanned on a [employer] Force scanner.

[Series 6: best diast 73 % · axial · 0.43mm/px · z∈[+63,+108]mm · 2 of 333 slices shown]
[im 111/333  vessel]
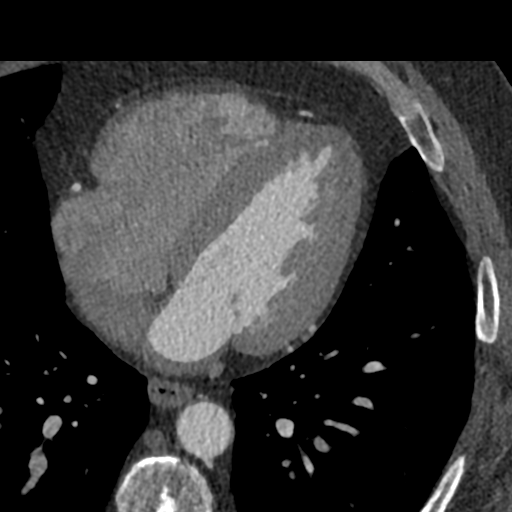
[im 222/333  vessel]
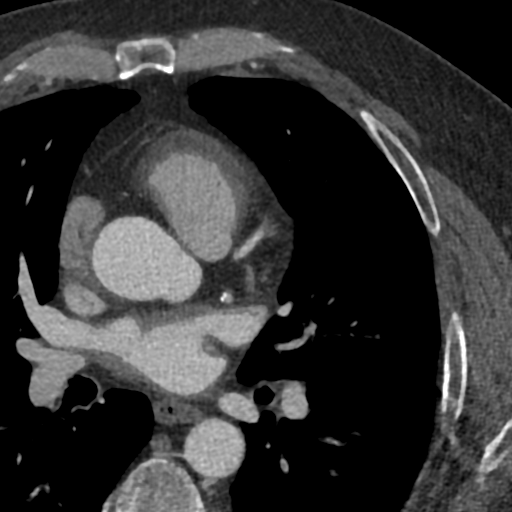

[Series 7: best syst · axial · 0.43mm/px · z∈[+63,+108]mm · 2 of 333 slices shown, 3 images]
[im 111/333  vessel]
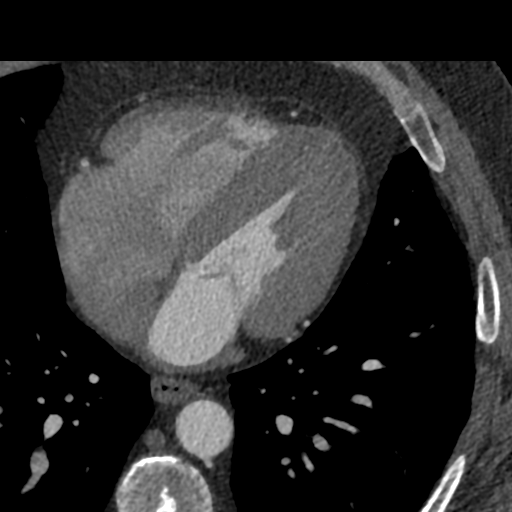
[im 111/333  lung]
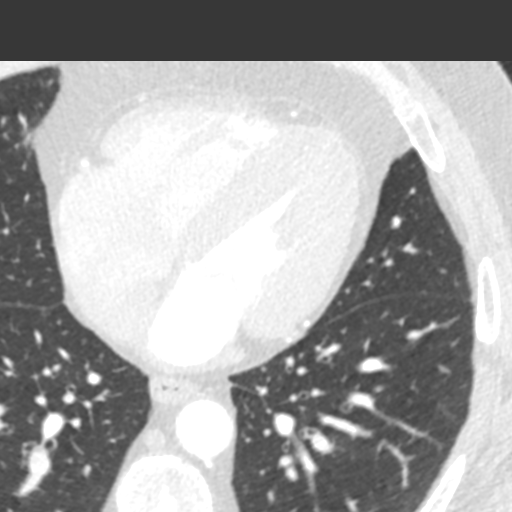
[im 222/333  vessel]
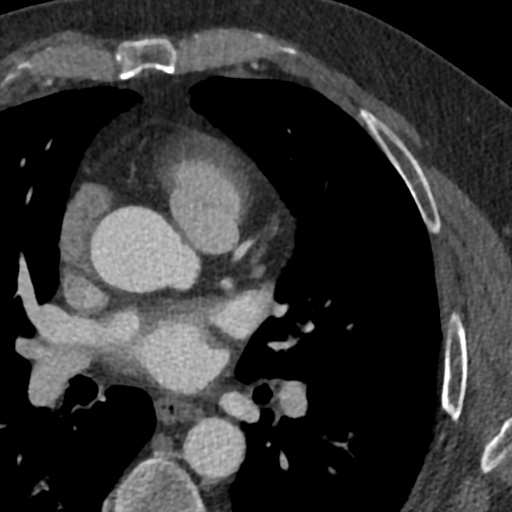

[Series 8: ts diast sharp · axial · 0.43mm/px · z∈[+63,+108]mm · 2 of 333 slices shown]
[im 111/333  lung]
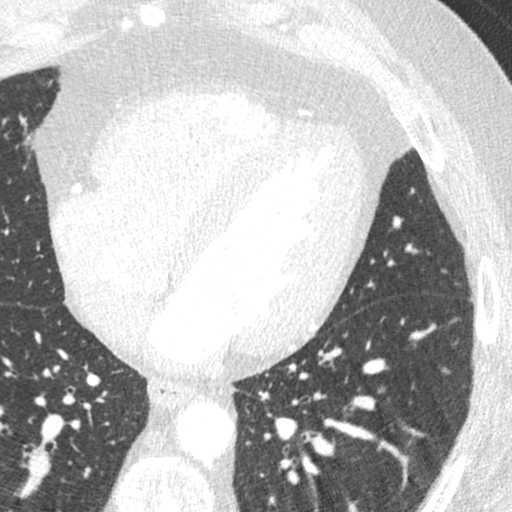
[im 222/333  lung]
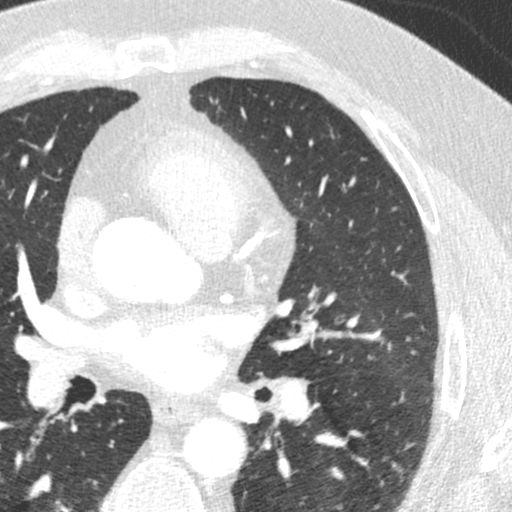

[Series 9: ts syst sharp · axial · 0.43mm/px · z∈[+63,+108]mm · 2 of 333 slices shown]
[im 111/333  lung]
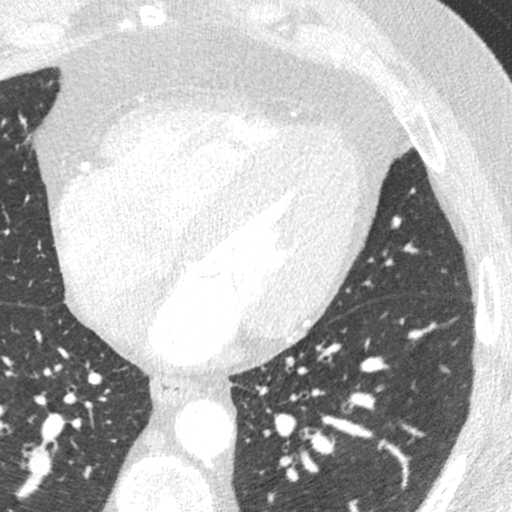
[im 222/333  lung]
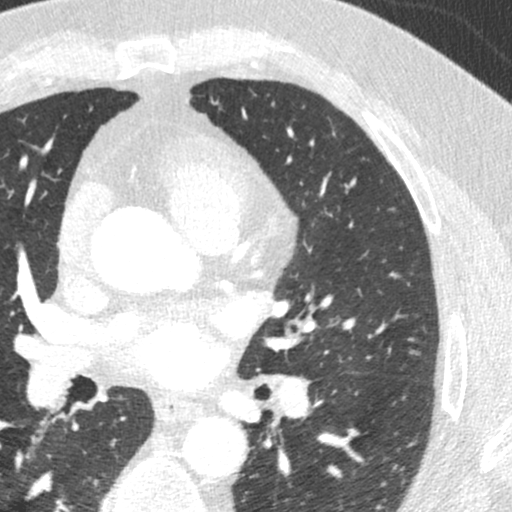

[8 of 20 positions shown; findings below may reference images not displayed]



Please see radiologist report for non cardiac findings.

Aorta: Normal size. Mild aortic arch calcifications. No dissection.

Aortic Valve:  Trileaflet.  No calcifications.

Coronary Arteries:  Normal coronary origin.  Right dominance.

RCA is a large dominant artery that gives rise to PDA and PLA. There
is small focal area of calcified non flow limiting plaque (0-24%).

Left main is a large artery that gives rise to LAD and LCX arteries
(high OM branch).

LAD is a large vessel that has no plaque.

LCX is a non-dominant artery that gives rise to one large OM1
branch. There is proximal calcified non flow limiting plaque
(0-24%).

Other findings:

Normal pulmonary vein drainage into the left atrium.

Normal left atrial appendage without a thrombus.

Normal size of the pulmonary artery.
IMPRESSION: 1. Coronary calcium score of 19. This was 33 percentile for age and
sex matched control.

2. Normal coronary origin with right dominance.

3. Mild non flow limiting mid RCA and proximal circumflex calcified
plaque (0-24% stenosis).

4.  Mildly dilated ascending aorta (40 mm).

5.  Mild aortic arch atherosclerosis.

6. CAD-RADS 1. Minimal non-obstructive CAD (0-24%). Consider
non-atherosclerotic causes of chest pain. Consider preventive
therapy and risk factor modification.

EXAM:
OVER-READ INTERPRETATION  CT CHEST

The following report is an over-read performed by radiologist Dr.
over-read does not include interpretation of cardiac or coronary
anatomy or pathology. The coronary calcium score and cardiac CTA
interpretation by the cardiologist is attached.
FINDINGS: Within the visualized portions of the thorax there are no suspicious
appearing pulmonary nodules or masses, there is no acute
consolidative airspace disease, no pleural effusions, no
pneumothorax and no lymphadenopathy. Visualized portions of the
upper abdomen are unremarkable. There are no aggressive appearing
lytic or blastic lesions noted in the visualized portions of the
skeleton.
IMPRESSION: No significant incidental noncardiac findings are noted.

*** End of Addendum ***
FINDINGS: A 120 kV prospective scan was triggered in the descending thoracic
aorta at 111 HU's. Axial non-contrast 3 mm slices were carried out
through the heart. The data set was analyzed on a dedicated work
station and scored using the Agatson method. Gantry rotation speed
was 250 msecs and collimation was .6 mm. Beta blockade and 0.8 mg of
sl NTG was given. The 3D data set was reconstructed in 5% intervals
of the 67-82 % of the R-R cycle. Diastolic phases were analyzed on a
dedicated work station using MPR, MIP and VRT modes. The patient
received 80 cc of contrast.

Please see radiologist report for non cardiac findings.

Aorta: Normal size. Mild aortic arch calcifications. No dissection.

Aortic Valve:  Trileaflet.  No calcifications.

Coronary Arteries:  Normal coronary origin.  Right dominance.

RCA is a large dominant artery that gives rise to PDA and PLA. There
is small focal area of calcified non flow limiting plaque (0-24%).

Left main is a large artery that gives rise to LAD and LCX arteries
(high OM branch).

LAD is a large vessel that has no plaque.

LCX is a non-dominant artery that gives rise to one large OM1
branch. There is proximal calcified non flow limiting plaque
(0-24%).

Other findings:

Normal pulmonary vein drainage into the left atrium.

Normal left atrial appendage without a thrombus.

Normal size of the pulmonary artery.
IMPRESSION: 1. Coronary calcium score of 19. This was 33 percentile for age and
sex matched control.

2. Normal coronary origin with right dominance.

3. Mild non flow limiting mid RCA and proximal circumflex calcified
plaque (0-24% stenosis).

4.  Mildly dilated ascending aorta (40 mm).

5.  Mild aortic arch atherosclerosis.

6. CAD-RADS 1. Minimal non-obstructive CAD (0-24%). Consider
non-atherosclerotic causes of chest pain. Consider preventive
therapy and risk factor modification.

## 2022-03-24 DIAGNOSIS — J45909 Unspecified asthma, uncomplicated: Secondary | ICD-10-CM | POA: Diagnosis not present

## 2022-03-24 DIAGNOSIS — E039 Hypothyroidism, unspecified: Secondary | ICD-10-CM | POA: Diagnosis not present

## 2022-03-24 DIAGNOSIS — E782 Mixed hyperlipidemia: Secondary | ICD-10-CM | POA: Diagnosis not present

## 2022-03-24 DIAGNOSIS — G4700 Insomnia, unspecified: Secondary | ICD-10-CM | POA: Diagnosis not present

## 2022-03-24 DIAGNOSIS — E1140 Type 2 diabetes mellitus with diabetic neuropathy, unspecified: Secondary | ICD-10-CM | POA: Diagnosis not present

## 2022-04-07 DIAGNOSIS — E1140 Type 2 diabetes mellitus with diabetic neuropathy, unspecified: Secondary | ICD-10-CM | POA: Diagnosis not present

## 2022-04-07 DIAGNOSIS — G4700 Insomnia, unspecified: Secondary | ICD-10-CM | POA: Diagnosis not present

## 2022-04-07 DIAGNOSIS — J45909 Unspecified asthma, uncomplicated: Secondary | ICD-10-CM | POA: Diagnosis not present

## 2022-04-07 DIAGNOSIS — I10 Essential (primary) hypertension: Secondary | ICD-10-CM | POA: Diagnosis not present

## 2022-04-07 DIAGNOSIS — E782 Mixed hyperlipidemia: Secondary | ICD-10-CM | POA: Diagnosis not present

## 2022-04-07 DIAGNOSIS — E039 Hypothyroidism, unspecified: Secondary | ICD-10-CM | POA: Diagnosis not present

## 2022-04-11 DIAGNOSIS — E1165 Type 2 diabetes mellitus with hyperglycemia: Secondary | ICD-10-CM | POA: Diagnosis not present

## 2022-04-11 DIAGNOSIS — Z794 Long term (current) use of insulin: Secondary | ICD-10-CM | POA: Diagnosis not present

## 2022-04-14 DIAGNOSIS — M79671 Pain in right foot: Secondary | ICD-10-CM | POA: Diagnosis not present

## 2022-04-14 DIAGNOSIS — I739 Peripheral vascular disease, unspecified: Secondary | ICD-10-CM | POA: Diagnosis not present

## 2022-04-14 DIAGNOSIS — M79672 Pain in left foot: Secondary | ICD-10-CM | POA: Diagnosis not present

## 2022-04-14 DIAGNOSIS — E1140 Type 2 diabetes mellitus with diabetic neuropathy, unspecified: Secondary | ICD-10-CM | POA: Diagnosis not present

## 2022-05-14 DIAGNOSIS — E7800 Pure hypercholesterolemia, unspecified: Secondary | ICD-10-CM | POA: Diagnosis not present

## 2022-05-14 DIAGNOSIS — E1165 Type 2 diabetes mellitus with hyperglycemia: Secondary | ICD-10-CM | POA: Diagnosis not present

## 2022-05-14 DIAGNOSIS — E039 Hypothyroidism, unspecified: Secondary | ICD-10-CM | POA: Diagnosis not present

## 2022-05-20 DIAGNOSIS — E7800 Pure hypercholesterolemia, unspecified: Secondary | ICD-10-CM | POA: Diagnosis not present

## 2022-05-20 DIAGNOSIS — I10 Essential (primary) hypertension: Secondary | ICD-10-CM | POA: Diagnosis not present

## 2022-05-20 DIAGNOSIS — E1165 Type 2 diabetes mellitus with hyperglycemia: Secondary | ICD-10-CM | POA: Diagnosis not present

## 2022-05-20 DIAGNOSIS — J45909 Unspecified asthma, uncomplicated: Secondary | ICD-10-CM | POA: Diagnosis not present

## 2022-05-20 DIAGNOSIS — E039 Hypothyroidism, unspecified: Secondary | ICD-10-CM | POA: Diagnosis not present

## 2022-05-30 DIAGNOSIS — H40033 Anatomical narrow angle, bilateral: Secondary | ICD-10-CM | POA: Diagnosis not present

## 2022-05-30 DIAGNOSIS — E119 Type 2 diabetes mellitus without complications: Secondary | ICD-10-CM | POA: Diagnosis not present

## 2022-06-23 DIAGNOSIS — I739 Peripheral vascular disease, unspecified: Secondary | ICD-10-CM | POA: Diagnosis not present

## 2022-06-23 DIAGNOSIS — M79672 Pain in left foot: Secondary | ICD-10-CM | POA: Diagnosis not present

## 2022-06-23 DIAGNOSIS — E1140 Type 2 diabetes mellitus with diabetic neuropathy, unspecified: Secondary | ICD-10-CM | POA: Diagnosis not present

## 2022-06-23 DIAGNOSIS — M79671 Pain in right foot: Secondary | ICD-10-CM | POA: Diagnosis not present

## 2022-06-24 DIAGNOSIS — J449 Chronic obstructive pulmonary disease, unspecified: Secondary | ICD-10-CM | POA: Diagnosis not present

## 2022-06-24 DIAGNOSIS — E782 Mixed hyperlipidemia: Secondary | ICD-10-CM | POA: Diagnosis not present

## 2022-06-24 DIAGNOSIS — I10 Essential (primary) hypertension: Secondary | ICD-10-CM | POA: Diagnosis not present

## 2022-06-24 DIAGNOSIS — E1140 Type 2 diabetes mellitus with diabetic neuropathy, unspecified: Secondary | ICD-10-CM | POA: Diagnosis not present

## 2022-06-24 DIAGNOSIS — E039 Hypothyroidism, unspecified: Secondary | ICD-10-CM | POA: Diagnosis not present

## 2022-06-24 DIAGNOSIS — J45909 Unspecified asthma, uncomplicated: Secondary | ICD-10-CM | POA: Diagnosis not present

## 2022-06-24 DIAGNOSIS — G4700 Insomnia, unspecified: Secondary | ICD-10-CM | POA: Diagnosis not present

## 2022-06-30 DIAGNOSIS — Z794 Long term (current) use of insulin: Secondary | ICD-10-CM | POA: Diagnosis not present

## 2022-06-30 DIAGNOSIS — E1165 Type 2 diabetes mellitus with hyperglycemia: Secondary | ICD-10-CM | POA: Diagnosis not present

## 2022-07-08 DIAGNOSIS — G4733 Obstructive sleep apnea (adult) (pediatric): Secondary | ICD-10-CM | POA: Diagnosis not present

## 2022-07-14 DIAGNOSIS — I2510 Atherosclerotic heart disease of native coronary artery without angina pectoris: Secondary | ICD-10-CM | POA: Diagnosis not present

## 2022-07-14 DIAGNOSIS — E1159 Type 2 diabetes mellitus with other circulatory complications: Secondary | ICD-10-CM | POA: Diagnosis not present

## 2022-07-14 DIAGNOSIS — R0609 Other forms of dyspnea: Secondary | ICD-10-CM | POA: Diagnosis not present

## 2022-07-14 DIAGNOSIS — J4540 Moderate persistent asthma, uncomplicated: Secondary | ICD-10-CM | POA: Diagnosis not present

## 2022-07-14 DIAGNOSIS — I700 Atherosclerosis of aorta: Secondary | ICD-10-CM | POA: Diagnosis not present

## 2022-07-14 DIAGNOSIS — I10 Essential (primary) hypertension: Secondary | ICD-10-CM | POA: Diagnosis not present

## 2022-07-16 ENCOUNTER — Encounter: Payer: Self-pay | Admitting: Internal Medicine

## 2022-07-17 ENCOUNTER — Telehealth: Payer: Self-pay | Admitting: Internal Medicine

## 2022-07-17 ENCOUNTER — Encounter: Payer: Self-pay | Admitting: Internal Medicine

## 2022-07-17 ENCOUNTER — Ambulatory Visit: Payer: Medicare Other | Attending: Internal Medicine | Admitting: Internal Medicine

## 2022-07-17 VITALS — BP 120/64 | HR 76 | Ht 70.0 in | Wt 271.0 lb

## 2022-07-17 DIAGNOSIS — R0609 Other forms of dyspnea: Secondary | ICD-10-CM

## 2022-07-17 DIAGNOSIS — I10 Essential (primary) hypertension: Secondary | ICD-10-CM

## 2022-07-17 DIAGNOSIS — I2510 Atherosclerotic heart disease of native coronary artery without angina pectoris: Secondary | ICD-10-CM | POA: Diagnosis not present

## 2022-07-17 MED ORDER — DAPAGLIFLOZIN PROPANEDIOL 10 MG PO TABS: 10.0000 mg | ORAL_TABLET | Freq: Every day | ORAL | 6 refills | Status: DC

## 2022-07-17 NOTE — Patient Instructions (Addendum)
Medication Instructions:  Your physician has recommended you make the following change in your medication:  Start farxiga 10 mg daily  Continue other medications the same  Labwork: none  Testing/Procedures: Your physician has requested that you have an echocardiogram. Echocardiography is a painless test that uses sound waves to create images of your heart. It provides your doctor with information about the size and shape of your heart and how well your heart's chambers and valves are working. This procedure takes approximately one hour. There are no restrictions for this procedure. Please do NOT wear cologne, perfume, aftershave, or lotions (deodorant is allowed). Please arrive 15 minutes prior to your appointment time.  Follow-Up: Your physician recommends that you schedule a follow-up appointment in: 6 months  Any Other Special Instructions Will Be Listed Below (If Applicable).  If you need a refill on your cardiac medications before your next appointment, please call your pharmacy.

## 2022-07-17 NOTE — Telephone Encounter (Signed)
Requested the name of the medication that was prescribed today during office visit. Advised farxiga 10 mg was prescribed. Verbalized understanding.

## 2022-07-17 NOTE — Telephone Encounter (Signed)
Patient called to talk with Dr. Dellia Cloud or nurse. Patient would not give specifics in regards to the questions that they need.

## 2022-07-17 NOTE — Progress Notes (Signed)
Cardiology Office Note  Date: 07/17/2022   ID: Eduardo Long, DOB 02-02-1956, MRN [id number]  PCP:  Orpah Melter, MD  Cardiologist:  Chalmers Guest, MD Electrophysiologist:  None   Reason for Office Visit: DOE evaluation   History of Present Illness: Eduardo Long is a 67 y.o. 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  Patient has been having DOE for quite some time and cannot perform his ADLs without feeling out of breath. Has some leg swelling but resolves after he elevates his legs in the nighttime mostly. Denies chest pain, syncope, palpitations.  He said he gained almost 60 pounds and is trying very hard to lose weight. His endocrinologist put him on Trulicity but insurance refused refilling prescriptions for some reason. He is requesting to prescribe Ozempic for weight loss.  Past Medical History:  Diagnosis Date   Asthma    Cellulitis    Coronary atherosclerosis    Mild, nonobstructive by cardiac CTA May 2021   Essential hypertension    H/O seasonal allergies    Hypothyroidism    Mixed hyperlipidemia    Sleep apnea    had sleep test 04-16-15, no results yet   Type 2 diabetes mellitus ([place])     Past Surgical History:  Procedure Laterality Date   CARPAL TUNNEL RELEASE Right 03/29/2015   Procedure: RIGHT CARPAL TUNNEL RELEASE;  Surgeon: Roseanne Kaufman, MD;  Location: [facility];  Service: Orthopedics;  Laterality: Right;   CARPAL TUNNEL RELEASE Left    COLONOSCOPY     LIPOMA EXCISION N/A 05/23/2015   Procedure: EXCISION LIPOMAS POSTERIOR AND ANTERIOR SCALP;  Surgeon: Erroll Luna, MD;  Location: [facility];  Service: General;  Laterality: N/A;   SINUS ENDO WITH FUSION Bilateral 03/15/2015   Procedure: ENDOSCOPIC SINUS SURGERY WITH FUSION;  Surgeon: Melida Quitter, MD;  Location: [facility];  Service: ENT;  Laterality: Bilateral;     Current Outpatient Medications  Medication Sig Dispense Refill   albuterol (PROAIR HFA) 108 (90 BASE) MCG/ACT inhaler Inhale 2 puffs into the lungs every 6 (six) hours as needed for wheezing or shortness of breath.     albuterol (PROVENTIL) (2.5 MG/3ML) 0.083% nebulizer solution Take 2.5 mg by nebulization every 6 (six) hours as needed for wheezing or shortness of breath.     atorvastatin (LIPITOR) 40 MG tablet Take 40 mg by mouth daily.     Budesonide-Formoterol Fumarate (SYMBICORT IN) Inhale into the lungs in the morning and at bedtime.      furosemide (LASIX) 40 MG tablet Take 1 tablet (40 mg total) by mouth daily. (Patient taking differently: Take 80 mg by mouth daily. ) 30 tablet 1   HUMALOG KWIKPEN 100 UNIT/ML KwikPen Sliding scale.     Insulin Detemir (LEVEMIR) 100 UNIT/ML Pen Inject 22 Units into the skin 2 (two) times daily. (Patient taking differently: Inject 70 Units into the skin 2 (two) times daily. ) 15 mL 1   Insulin NPH Human, Isophane, (HUMULIN N [address]) Inject into the skin.     Insulin Pen Needle 32G X 4 MM MISC Use with insulin pens to dispense insulin 100 each 9   ipratropium (ATROVENT HFA) 17 MCG/ACT inhaler Inhale 2 puffs into the lungs every 6 (six) hours as needed for wheezing. 1 Inhaler 12   KLOR-CON M20 20 MEQ tablet Take 20 mEq by mouth daily.  1   levothyroxine (SYNTHROID,  LEVOTHROID) 200 MCG tablet Take 200 mcg by mouth daily before breakfast.     metoprolol succinate (TOPROL-XL) 50 MG 24 hr tablet Take 1 tablet (50 mg total) by mouth daily. Take with or immediately following a meal. 90 tablet 1   montelukast (SINGULAIR) 10 MG tablet Take 1 tablet (10 mg total) by mouth daily. 30 tablet 11   Multiple Vitamin (MULTIVITAMIN) tablet Take 1 tablet by mouth daily.     ONETOUCH VERIO test strip 3 (three) times daily. for testing     TRULICITY [id number] 0000000 SOPN SMARTSIG:0.5 Milliliter(s) SUB-Q Once a Week     zolpidem (AMBIEN) 10 MG tablet Take 10 mg by mouth at bedtime  as needed for sleep.   5   No current facility-administered medications for this visit.   Allergies:  Patient has no known allergies.   Social History: The patient  reports that he has never smoked. He has never used smokeless tobacco. He reports that he does not drink alcohol and does not use drugs.   Family History: The patient's family history includes Colon cancer in his father; Diabetes Mellitus II in his brother; Heart attack in his maternal aunt; Heart disease in his maternal uncle; Stroke in his mother; Valvular heart disease in his mother.   ROS:  Please see the history of present illness. Otherwise, complete review of systems is positive for none.  All other systems are reviewed and negative.   Physical Exam: VS:  Ht 5' 10"$  (1.778 m)   Wt 273 lb 3.2 oz (123.9 kg)   BMI 39.20 kg/m , BMI Body mass index is 39.2 kg/m.  Wt Readings from Last 3 Encounters:  07/17/22 273 lb 3.2 oz (123.9 kg)  06/29/21 284 lb 13.4 oz (129.2 kg)  12/20/19 284 lb 12.8 oz (129.2 kg)    General: Patient appears comfortable at rest. HEENT: Conjunctiva and lids normal, oropharynx clear with moist mucosa. Neck: Supple, no elevated JVP or carotid bruits, no thyromegaly. Lungs: Clear to auscultation, nonlabored breathing at rest. Cardiac: Regular rate and rhythm, no S3 or significant systolic murmur, no pericardial rub. Abdomen: Soft, nontender, no hepatomegaly, bowel sounds present, no guarding or rebound. Extremities: No pitting edema, distal pulses 2+. Skin: Warm and dry. Musculoskeletal: No kyphosis. Neuropsychiatric: Alert and oriented x3, affect grossly appropriate.  ECG:  An ECG dated 07/17/2022 was personally reviewed today and demonstrated:  Normal sinus rhythm, RBBB  Recent Labwork: No results found for requested labs within last 365 days.  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  # DOE likely secondary to morbid obesity versus possible diastolic HF -Echo from [id number] showed normal diastolic parameters at rest but unclear if patient had abnormal diastology with exertion. This is a possibility due to risk factors and recent weight gain. Continue Lasix 80 mg once daily and start Farxiga 10 mg once daily. Obtain 2D echocardiogram.  # Mild nonobstructive CAD by CTA in 2021 -Coronary calcium score is 19 in 2021. No indication of aspirin.  # HTN, controlled -Continue Lasix 80 mg once daily -Continue metoprolol succinate 50 mg once daily -HTN management per PCP  I have spent a total of 30 minutes with patient reviewing chart, EKGs, labs and examining patient as well as establishing an assessment and plan that  was discussed with the patient.  > 50% of time was spent in direct patient care.      Medication Adjustments/Labs and Tests Ordered: Current medicines are reviewed at length with the patient today.  Concerns regarding medicines are outlined above.   Tests Ordered: Orders Placed This Encounter  Procedures   EKG 12-Lead    Medication Changes: No orders of the defined types were placed in this encounter.   Disposition:  Follow up  6 months  Signed Turrell Severt Fidel Levy, MD, 07/17/2022 7:59 AM    [facility] [place], [place], [place] [address]

## 2022-07-28 DIAGNOSIS — G4700 Insomnia, unspecified: Secondary | ICD-10-CM | POA: Diagnosis not present

## 2022-07-28 DIAGNOSIS — J45909 Unspecified asthma, uncomplicated: Secondary | ICD-10-CM | POA: Diagnosis not present

## 2022-07-28 DIAGNOSIS — I10 Essential (primary) hypertension: Secondary | ICD-10-CM | POA: Diagnosis not present

## 2022-07-28 DIAGNOSIS — E1140 Type 2 diabetes mellitus with diabetic neuropathy, unspecified: Secondary | ICD-10-CM | POA: Diagnosis not present

## 2022-07-28 DIAGNOSIS — E782 Mixed hyperlipidemia: Secondary | ICD-10-CM | POA: Diagnosis not present

## 2022-07-28 DIAGNOSIS — E039 Hypothyroidism, unspecified: Secondary | ICD-10-CM | POA: Diagnosis not present

## 2022-07-29 ENCOUNTER — Ambulatory Visit: Payer: Medicare Other | Attending: Internal Medicine

## 2022-07-29 DIAGNOSIS — R0609 Other forms of dyspnea: Secondary | ICD-10-CM

## 2022-07-29 LAB — ECHOCARDIOGRAM COMPLETE
AR max vel: 2.96 cm2
AV Peak grad: 11.2 mmHg
Ao pk vel: 1.67 m/s
Area-P 1/2: 3.39 cm2
Calc EF: 85 %
MV M vel: 3.76 m/s
MV Peak grad: 56.6 mmHg
S' Lateral: 3.4 cm
Single Plane A2C EF: 87.4 %
Single Plane A4C EF: 81.2 %

## 2022-07-29 MED ORDER — PERFLUTREN LIPID MICROSPHERE
1.0000 mL | INTRAVENOUS | Status: AC | PRN
  Administered 2022-07-29: 2 mL via INTRAVENOUS

## 2022-08-04 ENCOUNTER — Encounter: Payer: Self-pay | Admitting: *Deleted

## 2022-08-04 NOTE — Progress Notes (Signed)
Fax notification received from [employer] that Farxiga 10 mg approved by [employer] from 08/03/2022 through 06/01/2023

## 2022-08-20 DIAGNOSIS — E782 Mixed hyperlipidemia: Secondary | ICD-10-CM | POA: Diagnosis not present

## 2022-08-20 DIAGNOSIS — E039 Hypothyroidism, unspecified: Secondary | ICD-10-CM | POA: Diagnosis not present

## 2022-08-20 DIAGNOSIS — J45909 Unspecified asthma, uncomplicated: Secondary | ICD-10-CM | POA: Diagnosis not present

## 2022-08-20 DIAGNOSIS — E1140 Type 2 diabetes mellitus with diabetic neuropathy, unspecified: Secondary | ICD-10-CM | POA: Diagnosis not present

## 2022-08-21 ENCOUNTER — Other Ambulatory Visit (HOSPITAL_COMMUNITY): Payer: Self-pay

## 2022-08-21 MED ORDER — MOUNJARO 5 MG/0.5ML ~~LOC~~ SOAJ
5.0000 mg | SUBCUTANEOUS | 5 refills | Status: AC
  Filled 2022-08-21: qty 2, 28d supply, fill #0

## 2022-09-03 DIAGNOSIS — M79672 Pain in left foot: Secondary | ICD-10-CM | POA: Diagnosis not present

## 2022-09-03 DIAGNOSIS — E1140 Type 2 diabetes mellitus with diabetic neuropathy, unspecified: Secondary | ICD-10-CM | POA: Diagnosis not present

## 2022-09-03 DIAGNOSIS — M79671 Pain in right foot: Secondary | ICD-10-CM | POA: Diagnosis not present

## 2022-09-03 DIAGNOSIS — I739 Peripheral vascular disease, unspecified: Secondary | ICD-10-CM | POA: Diagnosis not present

## 2022-09-19 DIAGNOSIS — E1165 Type 2 diabetes mellitus with hyperglycemia: Secondary | ICD-10-CM | POA: Diagnosis not present

## 2022-09-23 DIAGNOSIS — J45909 Unspecified asthma, uncomplicated: Secondary | ICD-10-CM | POA: Diagnosis not present

## 2022-09-23 DIAGNOSIS — E039 Hypothyroidism, unspecified: Secondary | ICD-10-CM | POA: Diagnosis not present

## 2022-09-23 DIAGNOSIS — E1140 Type 2 diabetes mellitus with diabetic neuropathy, unspecified: Secondary | ICD-10-CM | POA: Diagnosis not present

## 2022-09-23 DIAGNOSIS — E782 Mixed hyperlipidemia: Secondary | ICD-10-CM | POA: Diagnosis not present

## 2022-10-07 DIAGNOSIS — G4733 Obstructive sleep apnea (adult) (pediatric): Secondary | ICD-10-CM | POA: Diagnosis not present

## 2022-10-23 DIAGNOSIS — E782 Mixed hyperlipidemia: Secondary | ICD-10-CM | POA: Diagnosis not present

## 2022-10-23 DIAGNOSIS — E1140 Type 2 diabetes mellitus with diabetic neuropathy, unspecified: Secondary | ICD-10-CM | POA: Diagnosis not present

## 2022-10-23 DIAGNOSIS — E039 Hypothyroidism, unspecified: Secondary | ICD-10-CM | POA: Diagnosis not present

## 2022-10-23 DIAGNOSIS — J45909 Unspecified asthma, uncomplicated: Secondary | ICD-10-CM | POA: Diagnosis not present

## 2022-11-19 DIAGNOSIS — E7800 Pure hypercholesterolemia, unspecified: Secondary | ICD-10-CM | POA: Diagnosis not present

## 2022-11-19 DIAGNOSIS — E039 Hypothyroidism, unspecified: Secondary | ICD-10-CM | POA: Diagnosis not present

## 2022-11-19 DIAGNOSIS — E1165 Type 2 diabetes mellitus with hyperglycemia: Secondary | ICD-10-CM | POA: Diagnosis not present

## 2022-11-25 DIAGNOSIS — E1140 Type 2 diabetes mellitus with diabetic neuropathy, unspecified: Secondary | ICD-10-CM | POA: Diagnosis not present

## 2022-11-25 DIAGNOSIS — M79671 Pain in right foot: Secondary | ICD-10-CM | POA: Diagnosis not present

## 2022-11-25 DIAGNOSIS — M79672 Pain in left foot: Secondary | ICD-10-CM | POA: Diagnosis not present

## 2022-11-25 DIAGNOSIS — I739 Peripheral vascular disease, unspecified: Secondary | ICD-10-CM | POA: Diagnosis not present

## 2022-11-26 DIAGNOSIS — E1165 Type 2 diabetes mellitus with hyperglycemia: Secondary | ICD-10-CM | POA: Diagnosis not present

## 2022-11-26 DIAGNOSIS — J45909 Unspecified asthma, uncomplicated: Secondary | ICD-10-CM | POA: Diagnosis not present

## 2022-11-26 DIAGNOSIS — E7800 Pure hypercholesterolemia, unspecified: Secondary | ICD-10-CM | POA: Diagnosis not present

## 2022-11-26 DIAGNOSIS — E039 Hypothyroidism, unspecified: Secondary | ICD-10-CM | POA: Diagnosis not present

## 2022-11-26 DIAGNOSIS — I10 Essential (primary) hypertension: Secondary | ICD-10-CM | POA: Diagnosis not present

## 2023-01-11 ENCOUNTER — Encounter: Payer: Self-pay | Admitting: Internal Medicine

## 2023-01-11 ENCOUNTER — Ambulatory Visit: Payer: Medicare Other | Attending: Internal Medicine | Admitting: Internal Medicine

## 2023-01-11 VITALS — BP 130/80 | HR 78 | Ht 69.0 in | Wt 260.0 lb

## 2023-01-11 DIAGNOSIS — I5032 Chronic diastolic (congestive) heart failure: Secondary | ICD-10-CM

## 2023-01-11 DIAGNOSIS — G4733 Obstructive sleep apnea (adult) (pediatric): Secondary | ICD-10-CM | POA: Diagnosis not present

## 2023-01-11 DIAGNOSIS — I2510 Atherosclerotic heart disease of native coronary artery without angina pectoris: Secondary | ICD-10-CM

## 2023-01-11 NOTE — Patient Instructions (Addendum)

## 2023-01-11 NOTE — Progress Notes (Signed)
Cardiology Office Note  Date: 01/11/2023   ID: Eduardo Long, DOB 12-22-1955, MRN [id number]  PCP:  Joycelyn Rua, MD  Cardiologist:  Marjo Bicker, MD Electrophysiologist:  None   History of Present Illness: Eduardo Long is a 67 y.o. 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  Ongoing chronic DOE, seen by pulmonology in 2021 thinks this could be restrictive lung defect from morbid obesity and moderate persistent asthma. Echo in 2/24 showed normal LVEF, normal diastology and no valvular heart disease. Due to risk factors of OSA and possibility of exertional diastolic dysfunction, he was started on p.o. Lasix 40 mg once daily which was increased to 80 mg once daily and was also started on Farxiga 10 mg once daily after which he noted some improvement in his SOB symptoms.  He is trying to lose weight, continues to have SOB but compared to last clinic visit.  No angina, dizziness, presyncope, syncope, palpitations or leg swelling.  Past Medical History:  Diagnosis Date   Asthma    Cellulitis    Coronary atherosclerosis    Mild, nonobstructive by cardiac CTA May 2021   Essential hypertension    H/O seasonal allergies    Hypothyroidism    Mixed hyperlipidemia    Sleep apnea    had sleep test 04-16-15, no results yet   Type 2 diabetes mellitus ([facility])     Past Surgical History:  Procedure Laterality Date   CARPAL TUNNEL RELEASE Right 03/29/2015   Procedure: RIGHT CARPAL TUNNEL RELEASE;  Surgeon: Dominica Severin, MD;  Location: [facility];  Service: Orthopedics;  Laterality: Right;   CARPAL TUNNEL RELEASE Left    COLONOSCOPY     LIPOMA EXCISION N/A 05/23/2015   Procedure: EXCISION LIPOMAS POSTERIOR AND ANTERIOR SCALP;  Surgeon: Harriette Bouillon, MD;  Location: [facility];  Service: General;  Laterality: N/A;   SINUS ENDO WITH FUSION Bilateral 03/15/2015   Procedure:  ENDOSCOPIC SINUS SURGERY WITH FUSION;  Surgeon: Christia Reading, MD;  Location: [facility];  Service: ENT;  Laterality: Bilateral;    Current Outpatient Medications  Medication Sig Dispense Refill   albuterol (PROAIR HFA) 108 (90 BASE) MCG/ACT inhaler Inhale 2 puffs into the lungs every 6 (six) hours as needed for wheezing or shortness of breath.     albuterol (PROVENTIL) (2.5 MG/3ML) 0.083% nebulizer solution Take 2.5 mg by nebulization every 6 (six) hours as needed for wheezing or shortness of breath.     atorvastatin (LIPITOR) 40 MG tablet Take 40 mg by mouth daily.     budesonide-formoterol (SYMBICORT) 160-4.5 MCG/ACT inhaler Inhale 2 puffs into the lungs 2 (two) times daily.     dapagliflozin propanediol (FARXIGA) 10 MG TABS tablet Take 1 tablet (10 mg total) by mouth daily before breakfast. 30 tablet 6   furosemide (LASIX) 40 MG tablet Take 1 tablet (40 mg total) by mouth daily. (Patient taking differently: Take 80 mg by mouth daily.) 30 tablet 1   HUMALOG KWIKPEN 100 UNIT/ML KwikPen Inject 18-20 Units into the skin 3 (three) times daily. Sliding scale.     insulin detemir (LEVEMIR) 100 UNIT/ML injection Inject 60 Units into the skin 2 (two) times daily.     Insulin NPH Human, Isophane, (HUMULIN N [place]) Inject 15 Units into the skin 2 (two) times daily.     Insulin Pen Needle 32G X 4 MM MISC Use with insulin pens to  dispense insulin 100 each 9   levothyroxine (SYNTHROID, LEVOTHROID) 200 MCG tablet Take 200 mcg by mouth daily before breakfast.     metoprolol succinate (TOPROL-XL) 50 MG 24 hr tablet Take 1 tablet (50 mg total) by mouth daily. Take with or immediately following a meal. 90 tablet 1   montelukast (SINGULAIR) 10 MG tablet Take 1 tablet (10 mg total) by mouth daily. 30 tablet 11   Multiple Vitamin (MULTIVITAMIN) tablet Take 1 tablet by mouth daily.     ONETOUCH VERIO test strip 3 (three) times daily. for testing     Potassium 99 MG TABS Take 1-2 tablets by mouth daily as  needed.     tirzepatide [facility]) 5 MG/0.5ML Pen Inject 5 mg into the skin once a week. 2 mL 5   Zinc 50 MG TABS Take 1 tablet by mouth daily as needed.     zolpidem (AMBIEN) 10 MG tablet Take 10 mg by mouth at bedtime as needed for sleep.   5   No current facility-administered medications for this visit.   Allergies:  Patient has no known allergies.   Social History: The patient  reports that he has never smoked. He has never used smokeless tobacco. He reports that he does not drink alcohol and does not use drugs.   Family History: The patient's family history includes Colon cancer in his father; Diabetes Mellitus II in his brother; Heart attack in his maternal aunt; Heart disease in his maternal uncle; Stroke in his mother; Valvular heart disease in his mother.   ROS:  Please see the history of present illness. Otherwise, complete review of systems is positive for none.  All other systems are reviewed and negative.   Physical Exam: VS:  BP 130/80 (BP Location: Left Arm, Patient Position: Sitting, Cuff Size: Large)   Pulse 78   Ht 5\' 9"  (1.753 m)   Wt 260 lb (117.9 kg)   SpO2 95%   BMI 38.40 kg/m , BMI Body mass index is 38.4 kg/m.  Wt Readings from Last 3 Encounters:  01/11/23 260 lb (117.9 kg)  07/17/22 271 lb (122.9 kg)  06/29/21 284 lb 13.4 oz (129.2 kg)    General: Patient appears comfortable at rest. HEENT: Conjunctiva and lids normal, oropharynx clear with moist mucosa. Neck: Supple, no elevated JVP or carotid bruits, no thyromegaly. Lungs: Clear to auscultation, nonlabored breathing at rest. Cardiac: Regular rate and rhythm, no S3 or significant systolic murmur, no pericardial rub. Abdomen: Soft, nontender, no hepatomegaly, bowel sounds present, no guarding or rebound. Extremities: No pitting edema, distal pulses 2+. Skin: Warm and dry. Musculoskeletal: No kyphosis. Neuropsychiatric: Alert and oriented x3, affect grossly appropriate.  ECG:  An ECG dated 07/17/2022  was personally reviewed today and demonstrated:  Normal sinus rhythm, RBBB  Recent Labwork: No results found for requested labs within last 365 days.  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  Chronic diastolic heart failure Echo in 2/24 showed normal LVEF, normal diastology and no valvular heart disease. Due to risk factors of OSA and possibility of exertional diastolic dysfunction, he was started on Lasix and Farxiga. DOE partially improved after starting Lasix and Farxiga which I will continue at the current doses (p.o. Lasix 80 mg once daily and Farxiga 10 mg once daily). This DOE could also  be secondary to restrictive lung defect from morbid obesity and moderate persistent asthma which he is very well aware and is trying very hard to lose weight.  OSA on CPAP Uses CPAP religiously, congratulated.  Mild nonobstructive CAD by CTA in 2021 Coronary calcium score is 19 in 2021, no indication of aspirin.  HTN, controlled Continue current antihypertensives, p.o. Lasix 80 mg once daily, metoprolol succinate 50 mg once daily, follows with PCP for HTN management.  I have spent a total of 30 minutes with patient reviewing chart, EKGs, labs and examining patient as well as establishing an assessment and plan that was discussed with the patient.  > 50% of time was spent in direct patient care.      Medication Adjustments/Labs and Tests Ordered: Current medicines are reviewed at length with the patient today.  Concerns regarding medicines are outlined above.   Tests Ordered: Orders Placed This Encounter  Procedures   EKG 12-Lead    Medication Changes: No orders of the defined types were placed in this encounter.   Disposition:  Follow up  6 months  Signed Martrell Eguia Verne Spurr, MD, 01/11/2023 11:33 AM    [facility] at [facility] [address] [place], [place], [place] [address]

## 2023-01-19 DIAGNOSIS — I110 Hypertensive heart disease with heart failure: Secondary | ICD-10-CM | POA: Diagnosis not present

## 2023-01-19 DIAGNOSIS — Z0000 Encounter for general adult medical examination without abnormal findings: Secondary | ICD-10-CM | POA: Diagnosis not present

## 2023-01-19 DIAGNOSIS — D692 Other nonthrombocytopenic purpura: Secondary | ICD-10-CM | POA: Diagnosis not present

## 2023-01-19 DIAGNOSIS — I700 Atherosclerosis of aorta: Secondary | ICD-10-CM | POA: Diagnosis not present

## 2023-01-19 DIAGNOSIS — I5032 Chronic diastolic (congestive) heart failure: Secondary | ICD-10-CM | POA: Diagnosis not present

## 2023-01-19 DIAGNOSIS — J4540 Moderate persistent asthma, uncomplicated: Secondary | ICD-10-CM | POA: Diagnosis not present

## 2023-01-19 DIAGNOSIS — E119 Type 2 diabetes mellitus without complications: Secondary | ICD-10-CM | POA: Diagnosis not present

## 2023-02-04 ENCOUNTER — Other Ambulatory Visit: Payer: Self-pay | Admitting: Internal Medicine

## 2023-02-08 DIAGNOSIS — E1140 Type 2 diabetes mellitus with diabetic neuropathy, unspecified: Secondary | ICD-10-CM | POA: Diagnosis not present

## 2023-02-08 DIAGNOSIS — M79671 Pain in right foot: Secondary | ICD-10-CM | POA: Diagnosis not present

## 2023-02-08 DIAGNOSIS — M79672 Pain in left foot: Secondary | ICD-10-CM | POA: Diagnosis not present

## 2023-02-08 DIAGNOSIS — I739 Peripheral vascular disease, unspecified: Secondary | ICD-10-CM | POA: Diagnosis not present

## 2023-03-04 ENCOUNTER — Encounter (INDEPENDENT_AMBULATORY_CARE_PROVIDER_SITE_OTHER): Payer: Self-pay | Admitting: *Deleted

## 2023-04-16 DIAGNOSIS — G4733 Obstructive sleep apnea (adult) (pediatric): Secondary | ICD-10-CM | POA: Diagnosis not present

## 2023-04-19 DIAGNOSIS — E1140 Type 2 diabetes mellitus with diabetic neuropathy, unspecified: Secondary | ICD-10-CM | POA: Diagnosis not present

## 2023-04-19 DIAGNOSIS — M79672 Pain in left foot: Secondary | ICD-10-CM | POA: Diagnosis not present

## 2023-04-19 DIAGNOSIS — M79671 Pain in right foot: Secondary | ICD-10-CM | POA: Diagnosis not present

## 2023-04-19 DIAGNOSIS — I739 Peripheral vascular disease, unspecified: Secondary | ICD-10-CM | POA: Diagnosis not present

## 2023-04-25 DIAGNOSIS — J441 Chronic obstructive pulmonary disease with (acute) exacerbation: Secondary | ICD-10-CM | POA: Diagnosis not present

## 2023-06-08 DIAGNOSIS — E039 Hypothyroidism, unspecified: Secondary | ICD-10-CM | POA: Diagnosis not present

## 2023-06-08 DIAGNOSIS — E7800 Pure hypercholesterolemia, unspecified: Secondary | ICD-10-CM | POA: Diagnosis not present

## 2023-06-08 DIAGNOSIS — E1165 Type 2 diabetes mellitus with hyperglycemia: Secondary | ICD-10-CM | POA: Diagnosis not present

## 2023-06-17 DIAGNOSIS — E039 Hypothyroidism, unspecified: Secondary | ICD-10-CM | POA: Diagnosis not present

## 2023-06-17 DIAGNOSIS — E7800 Pure hypercholesterolemia, unspecified: Secondary | ICD-10-CM | POA: Diagnosis not present

## 2023-06-17 DIAGNOSIS — E1165 Type 2 diabetes mellitus with hyperglycemia: Secondary | ICD-10-CM | POA: Diagnosis not present

## 2023-06-17 DIAGNOSIS — I10 Essential (primary) hypertension: Secondary | ICD-10-CM | POA: Diagnosis not present

## 2023-06-17 DIAGNOSIS — J45909 Unspecified asthma, uncomplicated: Secondary | ICD-10-CM | POA: Diagnosis not present

## 2023-06-28 DIAGNOSIS — E1140 Type 2 diabetes mellitus with diabetic neuropathy, unspecified: Secondary | ICD-10-CM | POA: Diagnosis not present

## 2023-06-28 DIAGNOSIS — M79672 Pain in left foot: Secondary | ICD-10-CM | POA: Diagnosis not present

## 2023-06-28 DIAGNOSIS — I739 Peripheral vascular disease, unspecified: Secondary | ICD-10-CM | POA: Diagnosis not present

## 2023-06-28 DIAGNOSIS — M79671 Pain in right foot: Secondary | ICD-10-CM | POA: Diagnosis not present

## 2023-07-14 DIAGNOSIS — J441 Chronic obstructive pulmonary disease with (acute) exacerbation: Secondary | ICD-10-CM | POA: Diagnosis not present

## 2023-07-14 DIAGNOSIS — J069 Acute upper respiratory infection, unspecified: Secondary | ICD-10-CM | POA: Diagnosis not present

## 2023-07-15 ENCOUNTER — Ambulatory Visit: Payer: Medicare Other | Admitting: Internal Medicine

## 2023-07-15 DIAGNOSIS — G5601 Carpal tunnel syndrome, right upper limb: Secondary | ICD-10-CM | POA: Diagnosis not present

## 2023-07-15 DIAGNOSIS — G4700 Insomnia, unspecified: Secondary | ICD-10-CM | POA: Diagnosis not present

## 2023-07-19 ENCOUNTER — Ambulatory Visit: Payer: Medicare Other | Attending: Internal Medicine | Admitting: Internal Medicine

## 2023-07-19 ENCOUNTER — Encounter: Payer: Self-pay | Admitting: Internal Medicine

## 2023-07-19 VITALS — BP 120/60 | HR 75 | Ht 69.0 in | Wt 225.6 lb

## 2023-07-19 DIAGNOSIS — I5032 Chronic diastolic (congestive) heart failure: Secondary | ICD-10-CM

## 2023-07-19 DIAGNOSIS — I2510 Atherosclerotic heart disease of native coronary artery without angina pectoris: Secondary | ICD-10-CM | POA: Diagnosis not present

## 2023-07-19 NOTE — Progress Notes (Signed)
 Cardiology Office Note  Date: 07/19/2023   ID: Eduardo Long, DOB 1955/07/01, MRN [id number]  PCP:  Joycelyn Rua, MD  Cardiologist:  Marjo Bicker, MD Electrophysiologist:  None  Reason for office visit: Chronic diastolic heart failure, nonobstructive CAD  History of Present Illness: Eduardo Long is a 68 y.o. 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  Ongoing chronic DOE, seen by pulmonology in 2021 thinks this could be restrictive lung defect from morbid obesity and moderate persistent asthma. Echo in 2/24 showed normal LVEF, normal diastology and no valvular heart disease. Due to risk factors of OSA and possibility of exertional diastolic dysfunction, he was started on p.o. Lasix 40 mg once daily which was increased to 80 mg once daily and was also started on Farxiga 10 mg once daily after which he noted some improvement in his SOB symptoms.  He is here for follow-up visit.  He stopped taking Lasix due to frequent urination.  Continuing Farxiga 10 mg once daily and lost around 30 to 40 pounds of weight after starting Mounjaro.  He noticed significant improvement in his DOE symptoms after starting [place].  No angina.  No dizziness, presyncope, syncope, palpitations and leg swelling.  Past Medical History:  Diagnosis Date   Asthma    Cellulitis    Coronary atherosclerosis    Mild, nonobstructive by cardiac CTA May 2021   Essential hypertension    H/O seasonal allergies    Hypothyroidism    Mixed hyperlipidemia    Sleep apnea    had sleep test 04-16-15, no results yet   Type 2 diabetes mellitus ([facility])     Past Surgical History:  Procedure Laterality Date   CARPAL TUNNEL RELEASE Right 03/29/2015   Procedure: RIGHT CARPAL TUNNEL RELEASE;  Surgeon: Dominica Severin, MD;  Location: [facility];  Service: Orthopedics;  Laterality: Right;   CARPAL TUNNEL RELEASE Left    COLONOSCOPY      LIPOMA EXCISION N/A 05/23/2015   Procedure: EXCISION LIPOMAS POSTERIOR AND ANTERIOR SCALP;  Surgeon: Harriette Bouillon, MD;  Location: [facility];  Service: General;  Laterality: N/A;   SINUS ENDO WITH FUSION Bilateral 03/15/2015   Procedure: ENDOSCOPIC SINUS SURGERY WITH FUSION;  Surgeon: Christia Reading, MD;  Location: [facility];  Service: ENT;  Laterality: Bilateral;    Current Outpatient Medications  Medication Sig Dispense Refill   albuterol (PROAIR HFA) 108 (90 BASE) MCG/ACT inhaler Inhale 2 puffs into the lungs every 6 (six) hours as needed for wheezing or shortness of breath.     albuterol (PROVENTIL) (2.5 MG/3ML) 0.083% nebulizer solution Take 2.5 mg by nebulization every 6 (six) hours as needed for wheezing or shortness of breath.     atorvastatin (LIPITOR) 40 MG tablet Take 40 mg by mouth daily.     budesonide-formoterol (SYMBICORT) 160-4.5 MCG/ACT inhaler Inhale 2 puffs into the lungs 2 (two) times daily.     dapagliflozin propanediol (FARXIGA) 10 MG TABS tablet TAKE 1 TABLET BY MOUTH BEFORE BREAKFAST *REFILL REQUEST* 30 tablet 10   furosemide (LASIX) 40 MG tablet Take 1 tablet (40 mg total) by mouth daily. (Patient taking differently: Take 80 mg by mouth daily.) 30 tablet 1   HUMALOG KWIKPEN 100 UNIT/ML KwikPen Inject 18-20 Units into the skin 3 (three) times daily. Sliding scale.     insulin detemir (LEVEMIR) 100 UNIT/ML injection Inject 60 Units into the skin 2 (two)  times daily.     Insulin NPH Human, Isophane, (HUMULIN N [place]) Inject 15 Units into the skin 2 (two) times daily.     Insulin Pen Needle 32G X 4 MM MISC Use with insulin pens to dispense insulin 100 each 9   levothyroxine (SYNTHROID, LEVOTHROID) 200 MCG tablet Take 200 mcg by mouth daily before breakfast.     metoprolol succinate (TOPROL-XL) 50 MG 24 hr tablet Take 1 tablet (50 mg total) by mouth daily. Take with or immediately following a meal. 90 tablet 1   montelukast (SINGULAIR) 10 MG  tablet Take 1 tablet (10 mg total) by mouth daily. 30 tablet 11   Multiple Vitamin (MULTIVITAMIN) tablet Take 1 tablet by mouth daily.     ONETOUCH VERIO test strip 3 (three) times daily. for testing     Potassium 99 MG TABS Take 1-2 tablets by mouth daily as needed.     tirzepatide [facility]) 5 MG/0.5ML Pen Inject 5 mg into the skin once a week. 2 mL 5   Zinc 50 MG TABS Take 1 tablet by mouth daily as needed.     zolpidem (AMBIEN) 10 MG tablet Take 10 mg by mouth at bedtime as needed for sleep.   5   No current facility-administered medications for this visit.   Allergies:  Patient has no known allergies.   Social History: The patient  reports that he has never smoked. He has never used smokeless tobacco. He reports that he does not drink alcohol and does not use drugs.   Family History: The patient's family history includes Colon cancer in his father; Diabetes Mellitus II in his brother; Heart attack in his maternal aunt; Heart disease in his maternal uncle; Stroke in his mother; Valvular heart disease in his mother.   ROS:  Please see the history of present illness. Otherwise, complete review of systems is positive for none.  All other systems are reviewed and negative.   Physical Exam: VS:  There were no vitals taken for this visit., BMI There is no height or weight on file to calculate BMI.  Wt Readings from Last 3 Encounters:  01/11/23 260 lb (117.9 kg)  07/17/22 271 lb (122.9 kg)  06/29/21 284 lb 13.4 oz (129.2 kg)    General: Patient appears comfortable at rest. HEENT: Conjunctiva and lids normal, oropharynx clear with moist mucosa. Neck: Supple, no elevated JVP or carotid bruits, no thyromegaly. Lungs: Clear to auscultation, nonlabored breathing at rest. Cardiac: Regular rate and rhythm, no S3 or significant systolic murmur, no pericardial rub. Abdomen: Soft, nontender, no hepatomegaly, bowel sounds present, no guarding or rebound. Extremities: No pitting edema, distal pulses  2+. Skin: Warm and dry. Musculoskeletal: No kyphosis. Neuropsychiatric: Alert and oriented x3, affect grossly appropriate.  ECG:  An ECG dated 07/17/2022 was personally reviewed today and demonstrated:  Normal sinus rhythm, RBBB  Recent Labwork: No results found for requested labs within last 365 days.  No results found for: "CHOL", "TRIG", "HDL", "CHOLHDL", "VLDL", "LDLCALC", "LDLDIRECT"   Assessment and Plan:   Chronic diastolic heart failure DOE significantly improved and resolved after starting [place].  Continue Farxiga 10 mg once daily.  He stopped taking Lasix as he had to urinate frequently.  Echo in 2/24 showed normal LVEF, normal diastology and no valvular heart disease.  Unclear if he has any exertional diastolic dysfunction.  PFTs in 2021 showed mild restrictive lung defect.  He also on 30 to 40 pounds of weight after starting Mounjaro.  OSA on CPAP  Continue CPAP.  Mild nonobstructive CAD by CTA in 2021 Coronary calcium score is 19 in 2021, no indication of aspirin.  He is worried that his family has a history of CAD.  HTN, controlled Continue current antihypertensives, metoprolol succinate 50 mg once daily, follows with PCP.  Not on Lasix anymore.    Medication Adjustments/Labs and Tests Ordered: Current medicines are reviewed at length with the patient today.  Concerns regarding medicines are outlined above.   Tests Ordered: Orders Placed This Encounter  Procedures   EKG 12-Lead    Medication Changes: No orders of the defined types were placed in this encounter.   Disposition:  Follow up 1 year  Signed Juanette Urizar Verne Spurr, MD, 07/19/2023 11:25 AM    [facility] at [facility] [address] [place], [place], [place] [address]

## 2023-07-19 NOTE — Patient Instructions (Addendum)

## 2023-07-22 DIAGNOSIS — G4733 Obstructive sleep apnea (adult) (pediatric): Secondary | ICD-10-CM | POA: Diagnosis not present

## 2023-08-06 DIAGNOSIS — G5601 Carpal tunnel syndrome, right upper limb: Secondary | ICD-10-CM | POA: Diagnosis not present

## 2023-08-06 DIAGNOSIS — M19041 Primary osteoarthritis, right hand: Secondary | ICD-10-CM | POA: Diagnosis not present

## 2023-08-07 DIAGNOSIS — S8001XA Contusion of right knee, initial encounter: Secondary | ICD-10-CM | POA: Diagnosis not present

## 2023-08-07 DIAGNOSIS — S20211A Contusion of right front wall of thorax, initial encounter: Secondary | ICD-10-CM | POA: Diagnosis not present

## 2023-08-10 DIAGNOSIS — J441 Chronic obstructive pulmonary disease with (acute) exacerbation: Secondary | ICD-10-CM | POA: Diagnosis not present

## 2023-09-02 ENCOUNTER — Encounter (INDEPENDENT_AMBULATORY_CARE_PROVIDER_SITE_OTHER): Payer: Self-pay | Admitting: *Deleted

## 2023-09-06 DIAGNOSIS — E1140 Type 2 diabetes mellitus with diabetic neuropathy, unspecified: Secondary | ICD-10-CM | POA: Diagnosis not present

## 2023-09-06 DIAGNOSIS — I739 Peripheral vascular disease, unspecified: Secondary | ICD-10-CM | POA: Diagnosis not present

## 2023-09-06 DIAGNOSIS — M79671 Pain in right foot: Secondary | ICD-10-CM | POA: Diagnosis not present

## 2023-09-06 DIAGNOSIS — M79672 Pain in left foot: Secondary | ICD-10-CM | POA: Diagnosis not present

## 2023-09-07 DIAGNOSIS — G4733 Obstructive sleep apnea (adult) (pediatric): Secondary | ICD-10-CM | POA: Diagnosis not present

## 2023-09-09 DIAGNOSIS — G5601 Carpal tunnel syndrome, right upper limb: Secondary | ICD-10-CM | POA: Diagnosis not present

## 2023-09-23 DIAGNOSIS — M25531 Pain in right wrist: Secondary | ICD-10-CM | POA: Diagnosis not present

## 2023-10-01 DIAGNOSIS — M25531 Pain in right wrist: Secondary | ICD-10-CM | POA: Diagnosis not present

## 2023-10-06 DIAGNOSIS — J45909 Unspecified asthma, uncomplicated: Secondary | ICD-10-CM | POA: Diagnosis not present

## 2023-10-06 DIAGNOSIS — E039 Hypothyroidism, unspecified: Secondary | ICD-10-CM | POA: Diagnosis not present

## 2023-10-06 DIAGNOSIS — E7800 Pure hypercholesterolemia, unspecified: Secondary | ICD-10-CM | POA: Diagnosis not present

## 2023-10-06 DIAGNOSIS — I10 Essential (primary) hypertension: Secondary | ICD-10-CM | POA: Diagnosis not present

## 2023-10-06 DIAGNOSIS — E1165 Type 2 diabetes mellitus with hyperglycemia: Secondary | ICD-10-CM | POA: Diagnosis not present

## 2023-10-14 DIAGNOSIS — M25531 Pain in right wrist: Secondary | ICD-10-CM | POA: Diagnosis not present

## 2023-11-15 DIAGNOSIS — E1140 Type 2 diabetes mellitus with diabetic neuropathy, unspecified: Secondary | ICD-10-CM | POA: Diagnosis not present

## 2023-11-15 DIAGNOSIS — I739 Peripheral vascular disease, unspecified: Secondary | ICD-10-CM | POA: Diagnosis not present

## 2023-11-15 DIAGNOSIS — M79672 Pain in left foot: Secondary | ICD-10-CM | POA: Diagnosis not present

## 2023-11-15 DIAGNOSIS — M79671 Pain in right foot: Secondary | ICD-10-CM | POA: Diagnosis not present

## 2023-12-16 DIAGNOSIS — E1165 Type 2 diabetes mellitus with hyperglycemia: Secondary | ICD-10-CM | POA: Diagnosis not present

## 2023-12-16 DIAGNOSIS — E039 Hypothyroidism, unspecified: Secondary | ICD-10-CM | POA: Diagnosis not present

## 2023-12-16 DIAGNOSIS — E7800 Pure hypercholesterolemia, unspecified: Secondary | ICD-10-CM | POA: Diagnosis not present

## 2023-12-23 DIAGNOSIS — I10 Essential (primary) hypertension: Secondary | ICD-10-CM | POA: Diagnosis not present

## 2023-12-23 DIAGNOSIS — E7800 Pure hypercholesterolemia, unspecified: Secondary | ICD-10-CM | POA: Diagnosis not present

## 2023-12-23 DIAGNOSIS — E1165 Type 2 diabetes mellitus with hyperglycemia: Secondary | ICD-10-CM | POA: Diagnosis not present

## 2023-12-23 DIAGNOSIS — E039 Hypothyroidism, unspecified: Secondary | ICD-10-CM | POA: Diagnosis not present

## 2023-12-23 DIAGNOSIS — J45909 Unspecified asthma, uncomplicated: Secondary | ICD-10-CM | POA: Diagnosis not present

## 2024-01-20 DIAGNOSIS — I10 Essential (primary) hypertension: Secondary | ICD-10-CM | POA: Diagnosis not present

## 2024-01-20 DIAGNOSIS — E1159 Type 2 diabetes mellitus with other circulatory complications: Secondary | ICD-10-CM | POA: Diagnosis not present

## 2024-01-20 DIAGNOSIS — Z0000 Encounter for general adult medical examination without abnormal findings: Secondary | ICD-10-CM | POA: Diagnosis not present

## 2024-01-20 DIAGNOSIS — I2510 Atherosclerotic heart disease of native coronary artery without angina pectoris: Secondary | ICD-10-CM | POA: Diagnosis not present

## 2024-01-20 DIAGNOSIS — D692 Other nonthrombocytopenic purpura: Secondary | ICD-10-CM | POA: Diagnosis not present

## 2024-01-20 DIAGNOSIS — J4540 Moderate persistent asthma, uncomplicated: Secondary | ICD-10-CM | POA: Diagnosis not present

## 2024-01-20 DIAGNOSIS — G4700 Insomnia, unspecified: Secondary | ICD-10-CM | POA: Diagnosis not present

## 2024-01-20 DIAGNOSIS — I5032 Chronic diastolic (congestive) heart failure: Secondary | ICD-10-CM | POA: Diagnosis not present

## 2024-01-20 DIAGNOSIS — E782 Mixed hyperlipidemia: Secondary | ICD-10-CM | POA: Diagnosis not present

## 2024-01-24 DIAGNOSIS — M79671 Pain in right foot: Secondary | ICD-10-CM | POA: Diagnosis not present

## 2024-01-24 DIAGNOSIS — M79672 Pain in left foot: Secondary | ICD-10-CM | POA: Diagnosis not present

## 2024-01-24 DIAGNOSIS — E1140 Type 2 diabetes mellitus with diabetic neuropathy, unspecified: Secondary | ICD-10-CM | POA: Diagnosis not present

## 2024-01-24 DIAGNOSIS — I739 Peripheral vascular disease, unspecified: Secondary | ICD-10-CM | POA: Diagnosis not present

## 2024-02-01 ENCOUNTER — Other Ambulatory Visit: Payer: Self-pay | Admitting: Internal Medicine

## 2024-05-24 ENCOUNTER — Emergency Department (HOSPITAL_COMMUNITY)
Admission: EM | Admit: 2024-05-24 | Discharge: 2024-05-24 | Disposition: A | Discharge status: Home / Self Care | Attending: Emergency Medicine | Admitting: Emergency Medicine

## 2024-05-24 ENCOUNTER — Emergency Department (HOSPITAL_COMMUNITY)

## 2024-05-24 ENCOUNTER — Encounter (HOSPITAL_COMMUNITY): Payer: Self-pay

## 2024-05-24 ENCOUNTER — Other Ambulatory Visit: Payer: Self-pay

## 2024-05-24 DIAGNOSIS — Z79899 Other long term (current) drug therapy: Secondary | ICD-10-CM | POA: Insufficient documentation

## 2024-05-24 DIAGNOSIS — W19XXXA Unspecified fall, initial encounter: Secondary | ICD-10-CM

## 2024-05-24 DIAGNOSIS — Z79890 Hormone replacement therapy (postmenopausal): Secondary | ICD-10-CM | POA: Diagnosis not present

## 2024-05-24 DIAGNOSIS — Z794 Long term (current) use of insulin: Secondary | ICD-10-CM | POA: Diagnosis not present

## 2024-05-24 DIAGNOSIS — S4991XA Unspecified injury of right shoulder and upper arm, initial encounter: Secondary | ICD-10-CM | POA: Diagnosis present

## 2024-05-24 DIAGNOSIS — J45909 Unspecified asthma, uncomplicated: Secondary | ICD-10-CM | POA: Diagnosis not present

## 2024-05-24 DIAGNOSIS — E119 Type 2 diabetes mellitus without complications: Secondary | ICD-10-CM | POA: Insufficient documentation

## 2024-05-24 DIAGNOSIS — S81819A Laceration without foreign body, unspecified lower leg, initial encounter: Secondary | ICD-10-CM

## 2024-05-24 DIAGNOSIS — W108XXA Fall (on) (from) other stairs and steps, initial encounter: Secondary | ICD-10-CM | POA: Diagnosis not present

## 2024-05-24 DIAGNOSIS — Z7951 Long term (current) use of inhaled steroids: Secondary | ICD-10-CM | POA: Insufficient documentation

## 2024-05-24 DIAGNOSIS — S42031A Displaced fracture of lateral end of right clavicle, initial encounter for closed fracture: Secondary | ICD-10-CM | POA: Insufficient documentation

## 2024-05-24 DIAGNOSIS — S81812A Laceration without foreign body, left lower leg, initial encounter: Secondary | ICD-10-CM | POA: Diagnosis not present

## 2024-05-24 DIAGNOSIS — I5030 Unspecified diastolic (congestive) heart failure: Secondary | ICD-10-CM | POA: Insufficient documentation

## 2024-05-24 DIAGNOSIS — I110 Hypertensive heart disease with heart failure: Secondary | ICD-10-CM | POA: Insufficient documentation

## 2024-05-24 DIAGNOSIS — Y92019 Unspecified place in single-family (private) house as the place of occurrence of the external cause: Secondary | ICD-10-CM | POA: Diagnosis not present

## 2024-05-24 DIAGNOSIS — S81811A Laceration without foreign body, right lower leg, initial encounter: Secondary | ICD-10-CM | POA: Diagnosis not present

## 2024-05-24 DIAGNOSIS — E039 Hypothyroidism, unspecified: Secondary | ICD-10-CM | POA: Diagnosis not present

## 2024-05-24 MED ORDER — HYDROCODONE-ACETAMINOPHEN 5-325 MG PO TABS
1.0000 | ORAL_TABLET | Freq: Once | ORAL | Status: AC
  Administered 2024-05-24: 1 via ORAL
  Filled 2024-05-24: qty 1

## 2024-05-24 MED ORDER — HYDROCODONE-ACETAMINOPHEN 5-325 MG PO TABS: 1.0000 | ORAL_TABLET | Freq: Four times a day (QID) | ORAL | 0 refills | Status: AC | PRN

## 2024-05-24 NOTE — ED Triage Notes (Signed)
 Pt presents with a fall down 2-3 steps coming out of the house while carrying some dishes. He hit his R shoulder against the house and sustained a lac to the R leg and has a bandage in place during triage. He denies hitting his head or LOC. He is not on anticoagulants.

## 2024-05-24 NOTE — ED Notes (Signed)
 Pt/family received d/c paperwork at this time. After going over the paperwork any questions, comments, or concerns were answered to the best of this nurse's knowledge. The pt/family verbally acknowledged the teachings/instructions.

## 2024-05-24 NOTE — Discharge Instructions (Addendum)
 It was a pleasure caring for you today in the emergency department.  Do not take the norco with your ambien  as this can make you very sleepy  Follow up with orthopedics regarding clavicle fracture  Please see your pcp for recheck of your leg wounds   You can follow up with neurosurgery for further evaluation of your chronic back concerns   Please return to the emergency department for any worsening or worrisome symptoms.
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
## 2024-05-26 MED FILL — Hydrocodone-Acetaminophen Tab 5-325 MG: ORAL | Qty: 6 | Status: AC
